# Patient Record
Sex: Male | Born: 1935 | Race: White | Hispanic: No | Smoking: Current every day smoker
Health system: Southern US, Community
[De-identification: ages and names within clinical notes are randomized; demographics above are authoritative.]

## PROBLEM LIST (undated history)

## (undated) DIAGNOSIS — I219 Acute myocardial infarction, unspecified: Secondary | ICD-10-CM

## (undated) DIAGNOSIS — E785 Hyperlipidemia, unspecified: Secondary | ICD-10-CM

## (undated) DIAGNOSIS — M1990 Unspecified osteoarthritis, unspecified site: Secondary | ICD-10-CM

## (undated) DIAGNOSIS — I739 Peripheral vascular disease, unspecified: Secondary | ICD-10-CM

## (undated) DIAGNOSIS — Z720 Tobacco use: Secondary | ICD-10-CM

## (undated) DIAGNOSIS — F1010 Alcohol abuse, uncomplicated: Secondary | ICD-10-CM

## (undated) DIAGNOSIS — I509 Heart failure, unspecified: Secondary | ICD-10-CM

## (undated) HISTORY — PX: CATARACT EXTRACTION W/ INTRAOCULAR LENS IMPLANT: SHX1309

## (undated) HISTORY — DX: Unspecified osteoarthritis, unspecified site: M19.90

## (undated) HISTORY — DX: Acute myocardial infarction, unspecified: I21.9

## (undated) HISTORY — DX: Heart failure, unspecified: I50.9

## (undated) HISTORY — DX: Hyperlipidemia, unspecified: E78.5

---

## 2007-01-24 DIAGNOSIS — I219 Acute myocardial infarction, unspecified: Secondary | ICD-10-CM

## 2007-01-24 HISTORY — DX: Acute myocardial infarction, unspecified: I21.9

## 2007-02-24 HISTORY — PX: EYE SURGERY: SHX253

## 2007-10-24 HISTORY — PX: CARDIAC CATHETERIZATION: SHX172

## 2010-08-09 ENCOUNTER — Encounter (INDEPENDENT_AMBULATORY_CARE_PROVIDER_SITE_OTHER): Payer: PRIVATE HEALTH INSURANCE | Admitting: Vascular Surgery

## 2010-08-09 DIAGNOSIS — I7092 Chronic total occlusion of artery of the extremities: Secondary | ICD-10-CM

## 2010-08-10 NOTE — Consult Note (Signed)
NEW PATIENT CONSULTATION  Nathaniel Curtis, Nathaniel Curtis DOB:  Apr 30, 1931                                       08/09/2010 ZOXWR#:[id number]  Patient presents today for evaluation of severe lower extremity arterial insufficiency.  He is a 77 year old gentleman with a long past cardiac and peripheral vascular history.  He had bilateral tissue loss on his toes and has completely healed the left foot and has nearly completely healed an ulceration over the dorsum of his right second toe.  He has had evaluation in [place], and I have this for review.  He did undergo noninvasive vascular laboratory studies there showing an ankle arm index of __________.  The waveforms suggest bilateral iliac or aortic stenosis.  He does have a history of prior catheterization attempt and reports that they were unable to access via his groin and has what sounds either a radial or a brachial approach for a cardiac cath.  He does have severe buttock claudication with walking.  He has had an extensive cardiac evaluation, and this does show severe cardiomyopathy with 20% ejection fraction.  PAST MEDICAL HISTORY:  Significant for elevated cholesterol, prior myocardial infarction.  SOCIAL HISTORY:  He is widowed with 8 children.  He smokes less than a pack of cigarettes per day.  He does have several alcohol drinks per night.  FAMILY HISTORY:  Negative for premature atherosclerotic disease.  REVIEW OF SYSTEMS:  No weight loss or gain.  He weighs 152 pounds.  He is 5 feet 7 inches tall. CARDIAC:  As above. MUSCULOSKELETAL:  For arthritis and joint pain, otherwise negative.  PHYSICAL EXAMINATION:  A well-developed and well-nourished white male appearing stated age in no acute stress.  Blood pressure is 146/72, pulse 100, respirations 20.  HEENT is normal.  His carotid arteries are without bruits bilaterally.  He has 2+ radial pulses bilaterally.  He has absent femoral and distal pulses bilaterally.   Heart:  Regular rate and rhythm without murmur. Chest:  Clear bilaterally.  Abdomen:  Soft, nontender.  No aneurysm or other masses noted.  Musculoskeletal shows no major deformity or cyanosis.  Neurologic:  No focal weakness or paresthesias.  Skin without ulcers or rashes except for the nearly healed superficial ulceration over his second toe on the right.  I discussed options with patient.  I would recommend a CT angiogram prior to attempting arteriography.  I suspect that there may be a high chance that he has complete occlusion of his aortoiliac segment with collateral flow.  If this is the case, he would require either aortobifemoral bypass or extra anatomic bypass.  I feel that his cardiac status is certainly marginal for attempt at aortofemoral bypass.  He is healing his lesions, and if he is not a percutaneous candidate, may be best served with observation only.  He understands and will undergo his CT angiogram at [facility], and then I will see him back for discussion pending this result.    Larina Earthly, M.D. Electronically Signed  TFE/MEDQ  D:  08/09/2010  T:  08/10/2010  Job:  [id number]  cc:   Dr. Terrilee Files, [place] Junious Dresser, MD

## 2010-08-31 ENCOUNTER — Encounter: Payer: Self-pay | Admitting: Vascular Surgery

## 2010-09-06 ENCOUNTER — Encounter: Payer: Self-pay | Admitting: Vascular Surgery

## 2010-09-06 ENCOUNTER — Ambulatory Visit (INDEPENDENT_AMBULATORY_CARE_PROVIDER_SITE_OTHER): Payer: PRIVATE HEALTH INSURANCE | Admitting: Vascular Surgery

## 2010-09-06 VITALS — BP 147/72 | HR 87 | Resp 16 | Ht 67.0 in | Wt 155.0 lb

## 2010-09-06 DIAGNOSIS — I7092 Chronic total occlusion of artery of the extremities: Secondary | ICD-10-CM

## 2010-09-06 NOTE — Progress Notes (Signed)
Subjective:     Patient ID: Nathaniel Curtis, male   DOB: January 06, 1932, 77 y.o.   MRN: [id number]  HPI The patient presents today for followup of his severe bilateral lower extremity arterial insufficiency. I had seen him in initial office visit on 08/09/2010. He had slowly healing ulcers on both feet unfortunately has gone on to complete healing on the left and near total healing on the right. He is here today discussed CT angiogram of his abdomen and pelvis. He does have severe claudication symptoms as well. He has known cardiomyopathy with diminished ejection fraction.  Review of Systems  Constitutional: Positive for activity change.  HENT: Positive for neck pain.   Eyes: Negative.   Respiratory: Negative.   Cardiovascular: Positive for leg swelling.  Gastrointestinal: Negative.   Genitourinary: Negative.   Musculoskeletal: Positive for back pain, arthralgias and gait problem.  Neurological: Negative.   Hematological: Bruises/bleeds easily.  Psychiatric/Behavioral: Negative.        Objective:   Physical Exam Well-developed well-nourished white male in no acute distress 2+ radial pulses bilaterally. Absent femoral and distal pulses bilaterally.   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 Extensive narrowing of his external iliac arteries bilaterally. Patent femoral and popliteal arteries bilaterally.    Assessment:     I discussed the significance of this with the patient. I explained that the indication for treatment would be progressive tissue loss. He has nearly healed his foot lesions bilaterally. He reports that he is unable to tolerate his level of pain in his lower extremities. I explained the option for aortofemoral bypass or extra-anatomic bypass with an Ax-fem and fem-fem bypass     Plan:     The patient is scheduled to see his cardiologist for preoperative clearance on September 11. I  will see him back for repeat office visit following this time to discuss treatment options.

## 2010-10-10 ENCOUNTER — Encounter: Payer: Self-pay | Admitting: Vascular Surgery

## 2010-10-11 ENCOUNTER — Ambulatory Visit: Payer: PRIVATE HEALTH INSURANCE | Admitting: Vascular Surgery

## 2011-02-07 ENCOUNTER — Ambulatory Visit: Payer: PRIVATE HEALTH INSURANCE | Admitting: Vascular Surgery

## 2011-09-24 ENCOUNTER — Inpatient Hospital Stay (HOSPITAL_COMMUNITY)
Admission: EM | Admit: 2011-09-24 | Discharge: 2011-09-29 | DRG: 300 | Disposition: A | Discharge status: Home / Self Care | Payer: Medicare Other | Attending: Internal Medicine | Admitting: Internal Medicine

## 2011-09-24 ENCOUNTER — Encounter (HOSPITAL_COMMUNITY): Payer: Self-pay | Admitting: Nurse Practitioner

## 2011-09-24 DIAGNOSIS — Z9849 Cataract extraction status, unspecified eye: Secondary | ICD-10-CM

## 2011-09-24 DIAGNOSIS — I745 Embolism and thrombosis of iliac artery: Principal | ICD-10-CM | POA: Diagnosis present

## 2011-09-24 DIAGNOSIS — L0390 Cellulitis, unspecified: Secondary | ICD-10-CM

## 2011-09-24 DIAGNOSIS — I359 Nonrheumatic aortic valve disorder, unspecified: Secondary | ICD-10-CM | POA: Diagnosis present

## 2011-09-24 DIAGNOSIS — M79609 Pain in unspecified limb: Secondary | ICD-10-CM

## 2011-09-24 DIAGNOSIS — Z720 Tobacco use: Secondary | ICD-10-CM

## 2011-09-24 DIAGNOSIS — I739 Peripheral vascular disease, unspecified: Secondary | ICD-10-CM

## 2011-09-24 DIAGNOSIS — E785 Hyperlipidemia, unspecified: Secondary | ICD-10-CM

## 2011-09-24 DIAGNOSIS — I10 Essential (primary) hypertension: Secondary | ICD-10-CM

## 2011-09-24 DIAGNOSIS — I252 Old myocardial infarction: Secondary | ICD-10-CM

## 2011-09-24 DIAGNOSIS — E871 Hypo-osmolality and hyponatremia: Secondary | ICD-10-CM | POA: Diagnosis present

## 2011-09-24 DIAGNOSIS — H409 Unspecified glaucoma: Secondary | ICD-10-CM | POA: Diagnosis present

## 2011-09-24 DIAGNOSIS — I509 Heart failure, unspecified: Secondary | ICD-10-CM

## 2011-09-24 DIAGNOSIS — L02619 Cutaneous abscess of unspecified foot: Secondary | ICD-10-CM | POA: Diagnosis present

## 2011-09-24 DIAGNOSIS — Z961 Presence of intraocular lens: Secondary | ICD-10-CM

## 2011-09-24 DIAGNOSIS — F1010 Alcohol abuse, uncomplicated: Secondary | ICD-10-CM

## 2011-09-24 DIAGNOSIS — B351 Tinea unguium: Secondary | ICD-10-CM | POA: Diagnosis present

## 2011-09-24 DIAGNOSIS — L03119 Cellulitis of unspecified part of limb: Secondary | ICD-10-CM | POA: Diagnosis present

## 2011-09-24 DIAGNOSIS — F17200 Nicotine dependence, unspecified, uncomplicated: Secondary | ICD-10-CM

## 2011-09-24 DIAGNOSIS — L03115 Cellulitis of right lower limb: Secondary | ICD-10-CM

## 2011-09-24 DIAGNOSIS — Z01810 Encounter for preprocedural cardiovascular examination: Secondary | ICD-10-CM

## 2011-09-24 DIAGNOSIS — R0989 Other specified symptoms and signs involving the circulatory and respiratory systems: Secondary | ICD-10-CM

## 2011-09-24 HISTORY — DX: Alcohol abuse, uncomplicated: F10.10

## 2011-09-24 HISTORY — DX: Peripheral vascular disease, unspecified: I73.9

## 2011-09-24 HISTORY — DX: Tobacco use: Z72.0

## 2011-09-24 LAB — POCT I-STAT, CHEM 8
Chloride: 100 mEq/L (ref 96–112)
HCT: 35 % — ABNORMAL LOW (ref 39.0–52.0)
Hemoglobin: 11.9 g/dL — ABNORMAL LOW (ref 13.0–17.0)
Potassium: 4.2 mEq/L (ref 3.5–5.1)
Sodium: 136 mEq/L (ref 135–145)

## 2011-09-24 LAB — CREATININE, SERUM
GFR calc Af Amer: >90 mL/min (ref >90)
GFR calc non Af Amer: 84 mL/min — ABNORMAL LOW (ref >90)

## 2011-09-24 LAB — CBC
Hemoglobin: 12 g/dL — ABNORMAL LOW (ref 13.0–17.0)
MCHC: 34.9 g/dL (ref 30.0–36.0)
MCHC: 34 g/dL (ref 30.0–36.0)
Platelets: 328 10*3/uL (ref 150–400)
RDW: 15.1 % (ref 11.5–15.5)
RDW: 15.1 % (ref 11.5–15.5)
WBC: 9.5 10*3/uL (ref 4.0–10.5)
WBC: 9.1 10*3/uL (ref 4.0–10.5)

## 2011-09-24 MED ORDER — LISINOPRIL 2.5 MG PO TABS
2.5000 mg | ORAL_TABLET | Freq: Every day | ORAL | Status: DC
  Administered 2011-09-25 – 2011-09-29 (×5): 2.5 mg via ORAL
  Filled 2011-09-24 (×5): qty 1

## 2011-09-24 MED ORDER — ENOXAPARIN SODIUM 40 MG/0.4ML ~~LOC~~ SOLN
40.0000 mg | SUBCUTANEOUS | Status: DC
  Administered 2011-09-24 – 2011-09-28 (×5): 40 mg via SUBCUTANEOUS
  Filled 2011-09-24 (×6): qty 0.4

## 2011-09-24 MED ORDER — SODIUM CHLORIDE 0.9 % IV SOLN
250.0000 mL | INTRAVENOUS | Status: DC | PRN
  Administered 2011-09-29: 250 mL via INTRAVENOUS

## 2011-09-24 MED ORDER — ONDANSETRON HCL 4 MG PO TABS: 4.0000 mg | ORAL_TABLET | Freq: Four times a day (QID) | ORAL | Status: DC | PRN

## 2011-09-24 MED ORDER — FOLIC ACID 1 MG PO TABS
1.0000 mg | ORAL_TABLET | Freq: Every day | ORAL | Status: DC
  Administered 2011-09-24 – 2011-09-29 (×6): 1 mg via ORAL
  Filled 2011-09-24 (×6): qty 1

## 2011-09-24 MED ORDER — NITROGLYCERIN 0.4 MG SL SUBL: 0.4000 mg | SUBLINGUAL_TABLET | SUBLINGUAL | Status: DC | PRN

## 2011-09-24 MED ORDER — ONDANSETRON HCL 4 MG/2ML IJ SOLN
4.0000 mg | Freq: Four times a day (QID) | INTRAMUSCULAR | Status: DC | PRN
Start: 2011-09-24 — End: 2011-09-29

## 2011-09-24 MED ORDER — ACETAMINOPHEN 650 MG RE SUPP: 650.0000 mg | Freq: Four times a day (QID) | RECTAL | Status: DC | PRN

## 2011-09-24 MED ORDER — OXYCODONE HCL 5 MG PO TABS
5.0000 mg | ORAL_TABLET | ORAL | Status: DC | PRN
  Administered 2011-09-24 – 2011-09-29 (×14): 5 mg via ORAL
  Filled 2011-09-24 (×15): qty 1

## 2011-09-24 MED ORDER — ACETAMINOPHEN 325 MG PO TABS: 650.0000 mg | ORAL_TABLET | Freq: Four times a day (QID) | ORAL | Status: DC | PRN

## 2011-09-24 MED ORDER — BIMATOPROST 0.03 % OP SOLN
1.0000 [drp] | Freq: Every day | OPHTHALMIC | Status: DC
  Filled 2011-09-24: qty 2.5

## 2011-09-24 MED ORDER — PIPERACILLIN-TAZOBACTAM 3.375 G IVPB
3.3750 g | Freq: Three times a day (TID) | INTRAVENOUS | Status: DC
  Administered 2011-09-24 – 2011-09-29 (×14): 3.375 g via INTRAVENOUS
  Filled 2011-09-24 (×18): qty 50

## 2011-09-24 MED ORDER — SODIUM CHLORIDE 0.9 % IJ SOLN: 3.0000 mL | INTRAMUSCULAR | Status: DC | PRN

## 2011-09-24 MED ORDER — SPIRONOLACTONE 25 MG PO TABS
25.0000 mg | ORAL_TABLET | Freq: Every day | ORAL | Status: DC
  Administered 2011-09-24 – 2011-09-28 (×5): 25 mg via ORAL
  Filled 2011-09-24 (×6): qty 1

## 2011-09-24 MED ORDER — SIMVASTATIN 20 MG PO TABS
20.0000 mg | ORAL_TABLET | Freq: Every day | ORAL | Status: DC
  Administered 2011-09-24 – 2011-09-28 (×5): 20 mg via ORAL
  Filled 2011-09-24 (×6): qty 1

## 2011-09-24 MED ORDER — METOPROLOL SUCCINATE ER 25 MG PO TB24
25.0000 mg | ORAL_TABLET | Freq: Every day | ORAL | Status: DC
  Administered 2011-09-25 – 2011-09-27 (×3): 25 mg via ORAL
  Filled 2011-09-24 (×3): qty 1

## 2011-09-24 MED ORDER — BIMATOPROST 0.01 % OP SOLN
1.0000 [drp] | Freq: Every day | OPHTHALMIC | Status: DC
  Administered 2011-09-24 – 2011-09-28 (×5): 1 [drp] via OPHTHALMIC
  Filled 2011-09-24: qty 2.5

## 2011-09-24 MED ORDER — FUROSEMIDE 20 MG PO TABS
20.0000 mg | ORAL_TABLET | Freq: Every day | ORAL | Status: DC
  Administered 2011-09-25 – 2011-09-26 (×2): 20 mg via ORAL
  Filled 2011-09-24 (×4): qty 1

## 2011-09-24 MED ORDER — ASPIRIN 81 MG PO TBEC: 81.0000 mg | DELAYED_RELEASE_TABLET | Freq: Every day | ORAL | Status: DC

## 2011-09-24 MED ORDER — SENNOSIDES-DOCUSATE SODIUM 8.6-50 MG PO TABS: 1.0000 | ORAL_TABLET | Freq: Every evening | ORAL | Status: DC | PRN

## 2011-09-24 MED ORDER — VANCOMYCIN HCL IN DEXTROSE 1-5 GM/200ML-% IV SOLN
1000.0000 mg | Freq: Once | INTRAVENOUS | Status: AC
  Administered 2011-09-24: 1000 mg via INTRAVENOUS
  Filled 2011-09-24: qty 200

## 2011-09-24 MED ORDER — PIPERACILLIN-TAZOBACTAM 3.375 G IVPB: 3.3750 g | Freq: Once | INTRAVENOUS | Status: DC

## 2011-09-24 MED ORDER — ASPIRIN EC 81 MG PO TBEC
81.0000 mg | DELAYED_RELEASE_TABLET | Freq: Every day | ORAL | Status: DC
  Administered 2011-09-24 – 2011-09-29 (×6): 81 mg via ORAL
  Filled 2011-09-24 (×6): qty 1

## 2011-09-24 MED ORDER — VANCOMYCIN HCL 1000 MG IV SOLR
1250.0000 mg | INTRAVENOUS | Status: DC
  Administered 2011-09-25 – 2011-09-28 (×4): 1250 mg via INTRAVENOUS
  Filled 2011-09-24 (×5): qty 1250

## 2011-09-24 MED ORDER — CLOPIDOGREL BISULFATE 75 MG PO TABS
75.0000 mg | ORAL_TABLET | Freq: Every day | ORAL | Status: DC
  Administered 2011-09-25 – 2011-09-29 (×5): 75 mg via ORAL
  Filled 2011-09-24 (×6): qty 1

## 2011-09-24 MED ORDER — GABAPENTIN 600 MG PO TABS
600.0000 mg | ORAL_TABLET | Freq: Three times a day (TID) | ORAL | Status: DC
  Administered 2011-09-24 – 2011-09-29 (×14): 600 mg via ORAL
  Filled 2011-09-24 (×16): qty 1

## 2011-09-24 MED ORDER — NICOTINE 21 MG/24HR TD PT24
21.0000 mg | MEDICATED_PATCH | Freq: Every day | TRANSDERMAL | Status: DC
  Filled 2011-09-24 (×6): qty 1

## 2011-09-24 MED ORDER — SODIUM CHLORIDE 0.9 % IJ SOLN
3.0000 mL | Freq: Two times a day (BID) | INTRAMUSCULAR | Status: DC
  Administered 2011-09-25 – 2011-09-29 (×2): 3 mL via INTRAVENOUS

## 2011-09-24 MED ORDER — MORPHINE SULFATE 2 MG/ML IJ SOLN
1.0000 mg | INTRAMUSCULAR | Status: DC | PRN
  Administered 2011-09-24 – 2011-09-29 (×15): 1 mg via INTRAVENOUS
  Filled 2011-09-24 (×16): qty 1

## 2011-09-24 MED ORDER — PNEUMOCOCCAL VAC POLYVALENT 25 MCG/0.5ML IJ INJ
0.5000 mL | INJECTION | INTRAMUSCULAR | Status: AC
  Administered 2011-09-25: 0.5 mL via INTRAMUSCULAR
  Filled 2011-09-24: qty 0.5

## 2011-09-24 MED ORDER — NIACIN ER 500 MG PO CPCR
1000.0000 mg | ORAL_CAPSULE | Freq: Every day | ORAL | Status: DC
  Administered 2011-09-24 – 2011-09-28 (×5): 1000 mg via ORAL
  Filled 2011-09-24 (×6): qty 2

## 2011-09-24 MED ORDER — VITAMIN B-1 100 MG PO TABS
100.0000 mg | ORAL_TABLET | Freq: Every day | ORAL | Status: DC
  Administered 2011-09-24 – 2011-09-29 (×6): 100 mg via ORAL
  Filled 2011-09-24 (×6): qty 1

## 2011-09-24 NOTE — ED Notes (Signed)
Pt reports history of poor circulation in R leg, Friday began to have severe R leg pain. Pt states he was told by dr early at one point he needed surgery on the leg but pt states " i was too stubborn and i dont want to come to the hospital." states granddaughter forced him to go to [facility] in [place] today and the doctor there sent him to ER. Pt RLE with redness, edema, open wound to foot.

## 2011-09-24 NOTE — Progress Notes (Signed)
VASCULAR LAB PRELIMINARY  ARTERIAL  ABI completed: Bilateral ABIs indicate severe reduction in arterial blood flow with abnormal waveforms.    RIGHT    LEFT    PRESSURE WAVEFORM  PRESSURE WAVEFORM  BRACHIAL 145 T BRACHIAL 140 T  DP   DP    AT 58 DM AT 71 DM  PT 71 DM PT 72 DM  PER   PER    GREAT TOE 88 NA GREAT TOE 54 NA    RIGHT LEFT  ABI 0.61 0.37     Nylee Barbuto, 09/24/2011, 5:31 PM

## 2011-09-24 NOTE — Consult Note (Signed)
[facility]  Referred by:  Dr. Lorenso Courier [facility] ED)  Reason for referral: BLE chronic ischemia  History of Present Illness  Nathaniel Curtis is a 78 y.o. (30-Nov-1931) male who presents with chief complaint: bilateral leg pain.  Onset of symptom occurred years ago, patient is unclear exact chronology.  Patient was evaluated by Dr. Arbie Cookey last year and was found to have significant iliac disease.  An Aortobifemoral vs axillobifemoral BPG was recommended to the patient.  He was to obtain Cardiology preoperative evaluation and then return for determination of his intervention, but patient was lost to follow-up.  Patient returns to ER today with severe pain at the urging of his grand daughtered.  Pain is described as "hurts", severity 10/10, and associated with rest and movement.  Patient has attempted to treat this pain with pain medications.  The patient has rest pain symptoms also and denies leg wounds/ulcers.  Atherosclerotic risk factors include: hyperlipidemia and smoking.  Past Medical History  Diagnosis Date  . Hyperlipidemia   . CHF (congestive heart failure)   . Heart attack 2009  . Arthritis   . Aortic stenosis   . Glaucoma     Past Surgical History  Procedure Date  . Cataract extraction w/ intraocular lens implant RIGHT EYE 2009    History   Social History  . Marital Status: Widowed    Spouse Name: N/A    Number of Children: N/A  . Years of Education: N/A   Occupational History  . Not on file.   Social History Main Topics  . Smoking status: Current Everyday Smoker -- 1.0 packs/day    Types: Cigarettes  . Smokeless tobacco: Not on file  . Alcohol Use: Yes     3 a night  . Drug Use: No  . Sexually Active:    Other Topics Concern  . Not on file   Social History Narrative  . No narrative on file    History reviewed. No pertinent family history.  No current facility-administered medications on file prior to encounter.   Current Outpatient  Prescriptions on File Prior to Encounter  Medication Sig Dispense Refill  . aspirin 81 MG EC tablet Take 81 mg by mouth daily.        . bimatoprost (LUMIGAN) 0.03 % ophthalmic solution Place 1 drop into the right eye at bedtime.        . furosemide (LASIX) 20 MG tablet Take 20 mg by mouth daily.        Marland Kitchen lisinopril (PRINIVIL,ZESTRIL) 2.5 MG tablet Take 2.5 mg by mouth daily.        . metoprolol succinate (TOPROL-XL) 25 MG 24 hr tablet Take 25 mg by mouth daily.      . niacin (NIASPAN) 500 MG CR tablet Take 1,000 mg by mouth at bedtime.      . simvastatin (ZOCOR) 20 MG tablet Take 20 mg by mouth at bedtime.        Marland Kitchen spironolactone (ALDACTONE) 25 MG tablet Take 25 mg by mouth at bedtime.       . nitroGLYCERIN (NITROSTAT) 0.4 MG SL tablet Place 0.4 mg under the tongue every 5 (five) minutes as needed. As needed for chest pain.        Allergies  Allergen Reactions  . Lyrica (Pregabalin) Swelling    Swelling in feet and ankles.    REVIEW OF SYSTEMS:  (Positives checked otherwise negative)  CARDIOVASCULAR:  [ ]  chest pain, [ ]  chest pressure, [ ]   palpitations, [ ]  shortness of breath when laying flat, [ ]  shortness of breath with exertion,   [x]  pain in feet when walking, [x]  pain in feet when laying flat, [ ]  history of blood clot in veins (DVT), [ ]  history of phlebitis, [ ]  swelling in legs, [ ]  varicose veins  PULMONARY:  [ ]  productive cough, [ ]  asthma, [ ]  wheezing  NEUROLOGIC:  [ ]  weakness in arms or legs, [ ]  numbness in arms or legs, [ ]  difficulty speaking or slurred speech, [ ]  temporary loss of vision in one eye, [ ]  dizziness  HEMATOLOGIC:  [ ]  bleeding problems, [ ]  problems with blood clotting too easily  MUSCULOSKEL:  [ ]  joint pain, [ ]  joint swelling  GASTROINTEST:  [ ]   Vomiting blood, [ ]   Blood in stool     GENITOURINARY:  [ ]   Burning with urination, [ ]   Blood in urine  PSYCHIATRIC:  [ ]  history of major depression  INTEGUMENTARY:  [x]  rashes, [ ]   ulcers  CONSTITUTIONAL:  [ ]  fever, [ ]  chills   Physical Examination  Filed Vitals:   09/24/11 1437 09/24/11 1732  BP: 116/74 133/66  Pulse: 89   Temp: 98.7 F (37.1 C) 98.2 F (36.8 C)  TempSrc:  Oral  Resp: 16 18  SpO2: 95% 95%   There is no height or weight on file to calculate BMI.  General: A&O x 3, WN, elderly  Head: [place]/AT  Ear/Nose/Throat: Hearing grossly intact, nares w/o erythema or drainage, oropharynx w/o Erythema/Exudate  Eyes: PERRLA, EOMI  Neck: Supple, no nuchal rigidity, no palpable LAD  Pulmonary: Sym exp, good air movt, CTAB, no rales, rhonchi, & wheezing  Cardiac: RRR, Nl S1, S2, no Murmurs, rubs or gallops  Vascular: Vessel Right Left  Radial Palpable Palpable  Ulnar Palpable Palpable  Brachial Palpable Palpable  Carotid Palpable, without bruit Palpable, without bruit  Aorta Not palpable N/A  Femoral Faintly Palpable Faintly Palpable  Popliteal Not palpable Not palpable  PT Not Palpable Not Palpable  DP Not Palpable Not Palpable   Gastrointestinal: soft, NTND, -G/R, - HSM, - masses, - CVAT B  Musculoskeletal: M/S 5/5 throughout including B DF/PF, B calves warm, B feet room temperature, petechial rashes on both feet and shins, some skin breakdown on toes on right side, arthritic changes in hands and right elbow  Neurologic: CN 2-12 intact , Pain and light touch intact in extremities including feet, Motor exam as listed above  Psychiatric: Judgment intact, Mood & affect appropriatefor pt's clinical situation  Dermatologic: See M/S exam for extremity exam, no rashes otherwise noted  Lymph : No Cervical, Axillary, or Inguinal lymphadenopathy   Non-Invasive Vascular Imaging  ABI (Date: 09/24/11)  RLE: 0.61, AT and PT: dampened monophasic  LLE: 0.37, AT and PT: dampened monophasic  Laboratory: CBC:    Component Value Date/Time   WBC 9.5 09/24/2011 1440   RBC 3.71* 09/24/2011 1440   HGB 11.9* 09/24/2011 1500   HCT 35.0* 09/24/2011 1500    PLT 366 09/24/2011 1440   MCV 92.7 09/24/2011 1440   MCH 32.3 09/24/2011 1440   MCHC 34.9 09/24/2011 1440   RDW 15.1 09/24/2011 1440    BMP:    Component Value Date/Time   NA 136 09/24/2011 1500   K 4.2 09/24/2011 1500   CL 100 09/24/2011 1500   GLUCOSE 103* 09/24/2011 1500   BUN 6 09/24/2011 1500   CREATININE 1.00 09/24/2011 1500    Coagulation: No  results found for this basename: INR, PROTIME   No results found for this basename: PTT   Medical Decision Making  Jamar Weatherall is a 78 y.o. male who presents with: known BLE chronic critical limb ischemia.   This patient has no evidence of a threatened limb with bilateral intact neurologic function and dopplerable signals.  Pt is being admitted to medical service for preoperative optimization.  Given the 5-10% mortality rate in national series for aortobifemoral bypass, preoperative cardiology consultation is necessary additionally.  A new echocardiogram will be needed to evaluate the severity of his known aortic stenosis.  Some of this work up may be available from his outside cardiologist.  Will plan on Aortogram, bilateral leg runoff, possible intervention via a left femoral or brachial approach on Tuesday with Dr. Myra Gianotti.  I discussed with the patient the natural history of critical limb ischemia: 25% require amputation in one year, 50% are able to maintain their limbs in one year, and 25-30% die in one year due to comorbidities. I discussed with the patient the nature of angiographic procedures, especially the limited patencies of any endovascular intervention. The patient is aware of that the risks of an angiographic procedure include but are not limited to: bleeding, infection, access site complications, embolization, rupture of treated vessel, dissection, possible need for emergent surgical intervention, and possible need for surgical procedures to treat the patient's pathology.  I discussed in depth with the patient the nature of  atherosclerosis, and emphasized the importance of maximal medical management including strict control of blood pressure, blood glucose, and lipid levels, antiplatelet agents, obtaining regular exercise, and cessation of smoking.  The patient is aware that without maximal medical management the underlying atherosclerotic disease process will progress, limiting the benefit of any interventions.  Thank you for allowing [place] to participate in this patient's care.  Leonides Sake, MD [facility] Office: [phone] Pager: [phone]  09/24/2011, 5:49 PM

## 2011-09-24 NOTE — ED Provider Notes (Addendum)
History     CSN: [id number]  Arrival date & time 09/24/11  1421   First MD Initiated Contact with Patient 09/24/11 1549      Chief Complaint  Patient presents with  . Leg Pain    (Consider location/radiation/quality/duration/timing/severity/associated sxs/prior treatment) HPI Comments: Mr. Kornegay presents for evaluation of discoloration of his right foot with weeping drainage.  He has a known history of peripheral vascular disease and was recommended for a revascularization procedure some time ago. He was not amenable to having the surgery performed at that time. Earlier today he was seen at an urgent care for evaluation of his foot. A visiting relative was concerned because it had not improved with the treatment he is already received. He states that over the last week he has had pain in his right foot, followed by redness, breakdown of the skin, and now clear weeping fluid. He reports that he is also had some similar drainage from between the toes on his left foot, but this is greatly improved with wound care. He has a neighbor that has been assisting him with the care of this. He denies any fevers, chills, nausea, vomiting and states that he otherwise feels well. He reports that he currently smokes.  Patient is a 78 y.o. male presenting with lower extremity pain. The history is provided by the patient.  Foot Pain This is a new problem. The current episode started more than 2 days ago (almost a week). The problem has been gradually worsening. Pertinent negatives include no chest pain, no abdominal pain, no headaches and no shortness of breath. Nothing aggravates the symptoms. Nothing relieves the symptoms.    Past Medical History  Diagnosis Date  . Hyperlipidemia   . CHF (congestive heart failure)   . Heart attack 2009  . Arthritis   . Aortic stenosis   . Glaucoma     Past Surgical History  Procedure Date  . Cataract extraction w/ intraocular lens implant RIGHT EYE 2009     History reviewed. No pertinent family history.  History  Substance Use Topics  . Smoking status: Current Everyday Smoker -- 1.0 packs/day    Types: Cigarettes  . Smokeless tobacco: Not on file  . Alcohol Use: Yes     3 a night      Review of Systems  Constitutional: Negative for fever, chills, activity change, appetite change and fatigue.  HENT: Negative for congestion, sore throat, facial swelling, rhinorrhea and trouble swallowing.   Eyes: Negative.   Respiratory: Positive for cough. Negative for choking, chest tightness, shortness of breath and wheezing.        Chronic  Cardiovascular: Negative for chest pain.  Gastrointestinal: Negative for nausea, abdominal pain, diarrhea, constipation and blood in stool.  Genitourinary: Negative.   Musculoskeletal: Positive for arthralgias. Negative for back pain, joint swelling and gait problem.  Skin: Positive for color change, pallor and wound. Negative for rash.  Neurological: Negative for tremors, syncope, weakness and headaches.  Psychiatric/Behavioral: Negative for dysphoric mood, decreased concentration and agitation.    Allergies  Lyrica  Home Medications   Current Outpatient Rx  Name Route Sig Dispense Refill  . ASPIRIN 81 MG PO TBEC Oral Take 81 mg by mouth daily.      Marland Kitchen BIMATOPROST 0.03 % OP SOLN Right Eye Place 1 drop into the right eye at bedtime.      . CLOPIDOGREL BISULFATE 75 MG PO TABS Oral Take 75 mg by mouth daily.    . FUROSEMIDE 20  MG PO TABS Oral Take 20 mg by mouth daily.      Marland Kitchen GABAPENTIN 600 MG PO TABS Oral Take 600 mg by mouth 3 (three) times daily.    Marland Kitchen LISINOPRIL 2.5 MG PO TABS Oral Take 2.5 mg by mouth daily.      Marland Kitchen METOPROLOL SUCCINATE ER 25 MG PO TB24 Oral Take 25 mg by mouth daily.    Marland Kitchen NIACIN ER (ANTIHYPERLIPIDEMIC) 500 MG PO TBCR Oral Take 1,000 mg by mouth at bedtime.    Marland Kitchen SIMVASTATIN 20 MG PO TABS Oral Take 20 mg by mouth at bedtime.      . SPIRONOLACTONE 25 MG PO TABS Oral Take 25 mg by  mouth at bedtime.     Marland Kitchen NITROGLYCERIN 0.4 MG SL SUBL Sublingual Place 0.4 mg under the tongue every 5 (five) minutes as needed. As needed for chest pain.      BP 116/74  Pulse 89  Temp 98.7 F (37.1 C)  Resp 16  SpO2 95%  Physical Exam  Nursing note and vitals reviewed. Constitutional: He is oriented to person, place, and time. No distress.       Pt appears thin but not cachectic  HENT:  Head: Normocephalic and atraumatic.  Right Ear: External ear normal.  Left Ear: External ear normal.  Nose: Nose normal.  Mouth/Throat: Oropharynx is clear and moist. No oropharyngeal exudate.  Eyes: Conjunctivae and EOM are normal. Pupils are equal, round, and reactive to light. Right eye exhibits no discharge. Left eye exhibits no discharge. No scleral icterus.  Neck: Normal range of motion. Neck supple. No JVD present. No tracheal deviation present. No thyromegaly present.  Cardiovascular: Normal rate, regular rhythm, S1 normal, S2 normal, normal heart sounds, intact distal pulses and normal pulses.  PMI is not displaced.  Exam reveals no gallop and no decreased pulses.   No murmur heard. Pulmonary/Chest: Effort normal. No accessory muscle usage or stridor. No apnea, not tachypneic and not bradypneic. No respiratory distress. He has decreased breath sounds. He has no wheezes. He has no rhonchi. He has no rales. He exhibits no tenderness.  Abdominal: Soft. Normal appearance and bowel sounds are normal. He exhibits no shifting dullness, no distension, no ascites, no pulsatile midline mass and no mass. There is no hepatosplenomegaly. There is no tenderness. There is no rebound, no guarding and no CVA tenderness. No hernia.  Musculoskeletal: Normal range of motion. He exhibits tenderness. He exhibits no edema.       Unable to palpate pulses in feet.  Lymphadenopathy:    He has no cervical adenopathy.  Neurological: He is alert and oriented to person, place, and time. No cranial nerve deficit.  Coordination normal.  Skin: Skin is warm and dry. He is not diaphoretic. There is erythema. There is pallor.       Pallor bilat feet.  Not breakdown of skin on right forefoot.  Erythma + weeping drainage.  Darkening of distal toes and distal plantar surface of foot.  Psychiatric: He has a normal mood and affect. His behavior is normal.    ED Course  Procedures (including critical care time)  Labs Reviewed  CBC - Abnormal; Notable for the following:    RBC 3.71 (*)     Hemoglobin 12.0 (*)     HCT 34.4 (*)     All other components within normal limits  POCT I-STAT, CHEM 8 - Abnormal; Notable for the following:    Glucose, Bld 103 (*)     Hemoglobin  11.9 (*)     HCT 35.0 (*)     All other components within normal limits   No results found.   No diagnosis found.   Date: 09/24/2011  Rate: 85 bpm  Rhythm: sinus  QRS Axis: normal  Intervals: borderline prolonged PR int (212 ms)  ST/T Wave abnormalities: normal  Conduction Disutrbances: PR int 212 ms  Narrative Interpretation:   Old EKG Reviewed: none available      MDM  Pt presents for evaluation of discoloration and drainage of wound on right foot.  He was having pain 2-3 days ago which has since improved.  Pt appears nontoxic, NAD.  Plan vascular study of leg (pt has known hx of PVD).  Will also start abx as pt has evidence of a cellulitis.  1627.  U/S pending.  Abx have been ordered.  Discussed with hospitalist and pt to be posted for admission.  Will also discuss with the on-call vascular surgeon.  1630.  Discussed with Dr. Imogene Burn (vascular surg).  He will follow his progress and provide recommendations.    Tobin Chad, MD 09/24/11 4098  Tobin Chad, MD 09/24/11 1191  Tobin Chad, MD 09/24/11 4782

## 2011-09-24 NOTE — H&P (Signed)
[facility]          History and Physical    PCP:   CAMPBELL,STEPHEN, MD   Vascular Surgeon: Gretta Began, MD  Chief Complaint:  "My right leg is flaring up"  HPI: Patient is a 78 year old white gentleman with a past medical history for tobacco abuse, CHF type unknown, hyperlipidemia and most importantly  peripheral vascular disease with complete occlusion of the right common iliac artery and almost complete occlusion of the left common iliac artery. He was seen by Dr. Gretta Began in July 2012 and at that point revascularization surgery was recommended however he refused at that time. Patient comes today at the urging of his granddaughter who is currently visiting him when she noticed that his right leg was red and weepy. Have been asked to admit him for further evaluation and management.  Allergies:   Allergies  Allergen Reactions  . Lyrica (Pregabalin) Swelling    Swelling in feet and ankles.      Past Medical History  Diagnosis Date  . Hyperlipidemia   . CHF (congestive heart failure)   . Heart attack 2009  . Arthritis   . Aortic stenosis   . Glaucoma     Past Surgical History  Procedure Date  . Cataract extraction w/ intraocular lens implant RIGHT EYE 2009    Prior to Admission medications   Medication Sig Start Date End Date Taking? Authorizing Provider  aspirin 81 MG EC tablet Take 81 mg by mouth daily.     Yes Historical Provider, MD  bimatoprost (LUMIGAN) 0.03 % ophthalmic solution Place 1 drop into the right eye at bedtime.     Yes Historical Provider, MD  clopidogrel (PLAVIX) 75 MG tablet Take 75 mg by mouth daily.   Yes Historical Provider, MD  furosemide (LASIX) 20 MG tablet Take 20 mg by mouth daily.     Yes Historical Provider, MD  gabapentin (NEURONTIN) 600 MG tablet Take 600 mg by mouth 3 (three) times daily.   Yes Historical Provider, MD  lisinopril (PRINIVIL,ZESTRIL) 2.5 MG tablet Take 2.5 mg by mouth daily.     Yes Historical Provider,  MD  metoprolol succinate (TOPROL-XL) 25 MG 24 hr tablet Take 25 mg by mouth daily.   Yes Historical Provider, MD  niacin (NIASPAN) 500 MG CR tablet Take 1,000 mg by mouth at bedtime.   Yes Historical Provider, MD  simvastatin (ZOCOR) 20 MG tablet Take 20 mg by mouth at bedtime.     Yes Historical Provider, MD  spironolactone (ALDACTONE) 25 MG tablet Take 25 mg by mouth at bedtime.    Yes Historical Provider, MD  nitroGLYCERIN (NITROSTAT) 0.4 MG SL tablet Place 0.4 mg under the tongue every 5 (five) minutes as needed. As needed for chest pain.    Historical Provider, MD    Social History:  reports that he has been smoking Cigarettes.  He has been smoking about 1 pack per day. He does not have any smokeless tobacco history on file. He reports that he drinks alcohol. He reports that he does not use illicit drugs.  History reviewed. No pertinent family history.  Review of Systems:  Constitutional: Denies fever, chills, diaphoresis, appetite change and fatigue.  HEENT: Denies photophobia, eye pain, redness, hearing loss, ear pain, congestion, sore throat, rhinorrhea, sneezing, mouth sores, trouble swallowing, neck pain, neck stiffness and tinnitus.   Respiratory: Denies SOB, DOE, cough, chest tightness,  and wheezing.   Cardiovascular: Denies chest pain, palpitations and leg swelling.  Gastrointestinal: Denies  nausea, vomiting, abdominal pain, diarrhea, constipation, blood in stool and abdominal distention.  Genitourinary: Denies dysuria, urgency, frequency, hematuria, flank pain and difficulty urinating.  Musculoskeletal: Denies myalgias, back pain, joint swelling, arthralgias and gait problem.  Skin: Denies pallor, rash and wound.  Neurological: Denies dizziness, seizures, syncope, weakness, light-headedness, numbness and headaches.  Hematological: Denies adenopathy. Easy bruising, personal or family bleeding history  Psychiatric/Behavioral: Denies suicidal ideation, mood changes, confusion,  nervousness, sleep disturbance and agitation   Physical Exam: Blood pressure 116/74, pulse 89, temperature 98.7 F (37.1 C), resp. rate 16, SpO2 95.00%. General: Alert, awake, oriented x3, in no acute distress, appears unkempt. HEENT: Normocephalic, atraumatic, pupils equal round and reactive to light, intact extraocular movements, moist mucous membranes, poor dentition. Neck: Supple, no JVD, no lymphadenopathy, no bruits, no goiter. Cardiovascular: Regular rate and rhythm, no murmurs, rubs or gallops. Lungs: Clear to auscultation bilaterally. Abdomen: Soft, nontender, nondistended, positive bowel sounds, no masses or organomegaly noted. Extremities: He has bilateral onychomycosis. On the left he has no edema, on the right he has redness to the dorsum of his foot and to the first 4 toes, he has maceration and weeping in between the toes as well. I am unable to palpate any popliteal or foot pulses. Neurologic: Grossly intact and nonfocal. Skin: Have not observed any rashes.   Labs on Admission:  Results for orders placed during the hospital encounter of 09/24/11 (from the past 48 hour(s))  CBC     Status: Abnormal   Collection Time   09/24/11  2:40 PM      Component Value Range Comment   WBC 9.5  4.0 - 10.5 K/uL    RBC 3.71 (*) 4.22 - 5.81 MIL/uL    Hemoglobin 12.0 (*) 13.0 - 17.0 g/dL    HCT 78.2 (*) 95.6 - 52.0 %    MCV 92.7  78.0 - 100.0 fL    MCH 32.3  26.0 - 34.0 pg    MCHC 34.9  30.0 - 36.0 g/dL    RDW 21.3  08.6 - 57.8 %    Platelets 366  150 - 400 K/uL   POCT I-STAT, CHEM 8     Status: Abnormal   Collection Time   09/24/11  3:00 PM      Component Value Range Comment   Sodium 136  135 - 145 mEq/L    Potassium 4.2  3.5 - 5.1 mEq/L    Chloride 100  96 - 112 mEq/L    BUN 6  6 - 23 mg/dL    Creatinine, Ser 4.69  0.50 - 1.35 mg/dL    Glucose, Bld 629 (*) 70 - 99 mg/dL    Calcium, Ion 5.28  4.13 - 1.30 mmol/L    TCO2 22  0 - 100 mmol/L    Hemoglobin 11.9 (*) 13.0 - 17.0 g/dL      HCT 24.4 (*) 01.0 - 52.0 %     Radiological Exams on Admission: No results found.  Assessment/Plan Principal Problem:  *PVD (peripheral vascular disease) with claudication Active Problems:  Cellulitis of right foot  HTN (hypertension)  Hyperlipidemia  Tobacco abuse  CHF, chronic   #1 peripheral vascular disease: He is noted to have complete occlusion of the right common iliac artery and almost complete occlusion of the left. I cannot palpate any pulses to that foot. Vascular surgery, Dr. Imogene Burn, has been consulted. ABIs are currently being performed.  #2 cellulitis of the right foot: We'll start him on IV vancomycin and Zosyn. Blood  cultures have been ordered. Patient is afebrile and nontoxic. I doubt that he will be able to heal well without a revascularization procedure.  #3 tobacco abuse: We'll place a nicotine patch while in the hospital, he has been counseled however is not willing to quit.  #4 history of alcohol abuse: We'll place on thiamine and folate while in the hospital.  #5 CHF, chronic, type unknown: From his medications would appear that he has systolic dysfunction however I am unable to locate an echo. Patient is from [place] and this is where he receives all of his medical care. We'll order a 2-D echo as he will most likely need this for preop clearance if we decide to proceed with revascularization procedure this hospitalization.   #6 DVT prophylaxis: Lovenox.  #7 CODE STATUS: This has been discussed with patient and he desires to BE a full code at this time.  Time Spent on Admission: 55 minutes.  Chaya Jan [facility] Pager: [phone] 09/24/2011, 5:17 PM

## 2011-09-24 NOTE — Progress Notes (Signed)
ANTIBIOTIC CONSULT NOTE - INITIAL  Pharmacy Consult for Vancomycin and Zosyn Indication: Cellulitis of R foot  Allergies  Allergen Reactions  . Lyrica (Pregabalin) Swelling    Swelling in feet and ankles.    Patient Measurements: Height: 5\' 7"  (170.2 cm) Weight: 154 lb 5.2 oz (70 kg) IBW/kg (Calculated) : 66.1   Vital Signs: Temp: 98.2 F (36.8 C) (09/01 1732) Temp src: Oral (09/01 1732) BP: 133/66 mmHg (09/01 1732) Pulse Rate: 89  (09/01 1437) Intake/Output from previous day:   Intake/Output from this shift:    Labs:  Basename 09/24/11 1500 09/24/11 1440  WBC -- 9.5  HGB 11.9* 12.0*  PLT -- 366  LABCREA -- --  CREATININE 1.00 --   Estimated Creatinine Clearance: 57.8 ml/min (by C-G formula based on Cr of 1). No results found for this basename: VANCOTROUGH:2,VANCOPEAK:2,VANCORANDOM:2,GENTTROUGH:2,GENTPEAK:2,GENTRANDOM:2,TOBRATROUGH:2,TOBRAPEAK:2,TOBRARND:2,AMIKACINPEAK:2,AMIKACINTROU:2,AMIKACIN:2, in the last 72 hours   Microbiology: No results found for this or any previous visit (from the past 720 hour(s)).  Medical History: Past Medical History  Diagnosis Date  . Hyperlipidemia   . CHF (congestive heart failure)   . Heart attack 2009  . Arthritis   . Aortic stenosis   . Glaucoma     Medications:  Prescriptions prior to admission  Medication Sig Dispense Refill  . aspirin 81 MG EC tablet Take 81 mg by mouth daily.        . bimatoprost (LUMIGAN) 0.03 % ophthalmic solution Place 1 drop into the right eye at bedtime.        . clopidogrel (PLAVIX) 75 MG tablet Take 75 mg by mouth daily.      . furosemide (LASIX) 20 MG tablet Take 20 mg by mouth daily.        Marland Kitchen gabapentin (NEURONTIN) 600 MG tablet Take 600 mg by mouth 3 (three) times daily.      Marland Kitchen lisinopril (PRINIVIL,ZESTRIL) 2.5 MG tablet Take 2.5 mg by mouth daily.        . metoprolol succinate (TOPROL-XL) 25 MG 24 hr tablet Take 25 mg by mouth daily.      . niacin (NIASPAN) 500 MG CR tablet Take 1,000  mg by mouth at bedtime.      . simvastatin (ZOCOR) 20 MG tablet Take 20 mg by mouth at bedtime.        Marland Kitchen spironolactone (ALDACTONE) 25 MG tablet Take 25 mg by mouth at bedtime.       . nitroGLYCERIN (NITROSTAT) 0.4 MG SL tablet Place 0.4 mg under the tongue every 5 (five) minutes as needed. As needed for chest pain.       Assessment: 78 y.o. male presents with R foot cellulitis. Noted pt with PVD also needing revascularization surgery. To begin broad spectrum antibiotics for cellulitis. Afeb. Wbc wnl. Blood culture pending. Vanco 1gm IV given in ED ~1704  Goal of Therapy:  Vancomycin trough level 10-15 mcg/ml  Plan:  1. Zosyn 3.375gm IV q8h. Each dose over 4 hours 2. Vancomycin 1250mg  IV q24h. Next dose tomorrow at noon. 3. Will f/u microbiological data, renal function, and pt's clinical condition  Christoper Fabian, PharmD, BCPS Clinical pharmacist, pager [phone] 09/24/2011,6:15 PM

## 2011-09-24 NOTE — ED Notes (Signed)
Dopplered bilateral DPP and marked with Xs and the flow is intermittent

## 2011-09-25 DIAGNOSIS — F1010 Alcohol abuse, uncomplicated: Secondary | ICD-10-CM

## 2011-09-25 LAB — BASIC METABOLIC PANEL
BUN: 11 mg/dL (ref 6–23)
Calcium: 9.2 mg/dL (ref 8.4–10.5)
GFR calc Af Amer: 75 mL/min — ABNORMAL LOW (ref >90)
GFR calc non Af Amer: 65 mL/min — ABNORMAL LOW (ref >90)
Glucose, Bld: 88 mg/dL (ref 70–99)
Potassium: 3.9 mEq/L (ref 3.5–5.1)
Sodium: 132 mEq/L — ABNORMAL LOW (ref 135–145)

## 2011-09-25 LAB — CBC
HCT: 33 % — ABNORMAL LOW (ref 39.0–52.0)
MCH: 31.7 pg (ref 26.0–34.0)
MCHC: 34.2 g/dL (ref 30.0–36.0)
RDW: 14.8 % (ref 11.5–15.5)

## 2011-09-25 MED ORDER — POLYVINYL ALCOHOL 1.4 % OP SOLN
2.0000 [drp] | OPHTHALMIC | Status: DC | PRN
  Administered 2011-09-26: 2 [drp] via OPHTHALMIC
  Filled 2011-09-25: qty 15

## 2011-09-25 MED ORDER — SODIUM CHLORIDE 0.9 % IV SOLN
INTRAVENOUS | Status: DC
  Administered 2011-09-25 – 2011-09-27 (×4): via INTRAVENOUS

## 2011-09-25 NOTE — Progress Notes (Signed)
[facility]             Progress Note   Subjective: No complaints/events. Less pain to bilateral legs today.  Objective: Vital signs in last 24 hours: Temp:  [97.9 F (36.6 C)-98.7 F (37.1 C)] 97.9 F (36.6 C) (09/02 0520) Pulse Rate:  [82-95] 84  (09/02 0520) Resp:  [16-20] 17  (09/02 0520) BP: (108-136)/(61-77) 108/77 mmHg (09/02 0520) SpO2:  [95 %-98 %] 96 % (09/02 0520) Weight:  [68.04 kg (150 lb)-70 kg (154 lb 5.2 oz)] 68.04 kg (150 lb) (09/01 1800) Weight change:  Last BM Date: 09/24/11  Intake/Output from previous day: 09/01 0701 - 09/02 0700 In: -  Out: 150 [Urine:150]     Physical Exam: General: Alert, awake, oriented x3, in no acute distress. HEENT: No bruits, no goiter. Heart: Regular rate and rhythm, without murmurs, rubs, gallops. Lungs: Clear to auscultation bilaterally. Abdomen: Soft, nontender, nondistended, positive bowel sounds. Extremities: He has bilateral onychomycosis. On the left he has no edema, on the right he has redness to the dorsum of his foot and to the first 4 toes, he has maceration and weeping in between the toes as well. I am unable to palpate any popliteal or foot pulses.  Neuro: Grossly intact, nonfocal.    Lab Results: Basic Metabolic Panel:  Basename 09/25/11 0459 09/24/11 1823 09/24/11 1500  NA 132* -- 136  K 3.9 -- 4.2  CL 98 -- 100  CO2 25 -- --  GLUCOSE 88 -- 103*  BUN 11 -- 6  CREATININE 1.07 0.81 --  CALCIUM 9.2 -- --  MG -- -- --  PHOS -- -- --   CBC:  Basename 09/25/11 0459 09/24/11 1823  WBC 7.4 9.1  NEUTROABS -- --  HGB 11.3* 12.1*  HCT 33.0* 35.6*  MCV 92.4 93.2  PLT 302 328    Studies/Results: No results found.  Medications: Scheduled Meds:   . aspirin EC  81 mg Oral Daily  . bimatoprost  1 drop Right Eye QHS  . clopidogrel  75 mg Oral QAC breakfast  . enoxaparin (LOVENOX) injection  40 mg Subcutaneous Q24H  . folic acid  1 mg Oral Daily  . furosemide  20 mg Oral Daily  .  gabapentin  600 mg Oral TID  . lisinopril  2.5 mg Oral Daily  . metoprolol succinate  25 mg Oral Daily  . niacin  1,000 mg Oral QHS  . nicotine  21 mg Transdermal Daily  . piperacillin-tazobactam (ZOSYN)  IV  3.375 g Intravenous Q8H  . pneumococcal 23 valent vaccine  0.5 mL Intramuscular Tomorrow-1000  . simvastatin  20 mg Oral QHS  . sodium chloride  3 mL Intravenous Q12H  . spironolactone  25 mg Oral QHS  . thiamine  100 mg Oral Daily  . vancomycin  1,250 mg Intravenous Q24H  . vancomycin  1,000 mg Intravenous Once  . DISCONTD: aspirin  81 mg Oral Daily  . DISCONTD: bimatoprost  1 drop Right Eye QHS  . DISCONTD: piperacillin-tazobactam (ZOSYN)  IV  3.375 g Intravenous Once   Continuous Infusions:  PRN Meds:.sodium chloride, acetaminophen, acetaminophen, morphine injection, nitroGLYCERIN, ondansetron (ZOFRAN) IV, ondansetron, oxyCODONE, senna-docusate, sodium chloride  Assessment/Plan:  Principal Problem:  *PVD (peripheral vascular disease) with claudication Active Problems:  Cellulitis of right foot  HTN (hypertension)  Hyperlipidemia  Tobacco abuse  CHF, chronic  Alcohol abuse  #1 peripheral vascular disease: He is noted to have complete occlusion of the right common iliac artery and almost  complete occlusion of the left on prior CT. Plan as per vascular surgery.   #2 cellulitis of the right foot: We'll start him on IV vancomycin and Zosyn. Blood cultures have been ordered. Patient is afebrile and nontoxic.   #3 tobacco abuse: We'll place a nicotine patch while in the hospital, he has been counseled however is not willing to quit.   #4 history of alcohol abuse: We'll place on thiamine and folate while in the hospital.   #5 CHF, chronic, type unknown: From his medications would appear that he has systolic dysfunction however I am unable to locate an echo. Patient is from [place] and this is where he receives all of his medical care. We'll order a 2-D echo as he will most  likely need this for preop clearance if we decide to proceed with revascularization procedure this hospitalization.   #6 DVT prophylaxis: Lovenox.   #7 CODE STATUS: This has been discussed with patient and he desires to BE a full code at this time.   LOS: 1 day   [facility] Pager: [phone] 09/25/2011, 8:46 AM

## 2011-09-25 NOTE — Progress Notes (Addendum)
[facility]  Daily Progress Note  Assessment/Planning: BLE CLI, AS   Preop w/u in progress: cardio c/s tomorrow, hopefully echo tomorrow  Legs look better today  Will try to arrange Ao, BRo tomorrow  NS @ 100 cc/hr to help limit CIN  Subjective    Pain is better  Objective Filed Vitals:   09/24/11 1732 09/24/11 1800 09/24/11 2215 09/25/11 0520  BP: 133/66 122/61 136/64 108/77  Pulse:  82 95 84  Temp: 98.2 F (36.8 C) 98.6 F (37 C) 98.6 F (37 C) 97.9 F (36.6 C)  TempSrc: Oral Oral Oral Oral  Resp: 18 20 19 17   Height: 5\' 7"  (1.702 m) 5\' 7"  (1.702 m)    Weight: 154 lb 5.2 oz (70 kg) 150 lb (68.04 kg)    SpO2: 95% 98% 96% 96%    Intake/Output Summary (Last 24 hours) at 09/25/11 0933 Last data filed at 09/25/11 0500  Gross per 24 hour  Intake      0 ml  Output    150 ml  Net   -150 ml    PULM  CTAB CV  RRR GI  soft, NTND VASC  Don't feel femoral pulses today, B feet warm, petechiae mostly resolved  Laboratory CBC    Component Value Date/Time   WBC 7.4 09/25/2011 0459   HGB 11.3* 09/25/2011 0459   HCT 33.0* 09/25/2011 0459   PLT 302 09/25/2011 0459    BMET    Component Value Date/Time   NA 132* 09/25/2011 0459   K 3.9 09/25/2011 0459   CL 98 09/25/2011 0459   CO2 25 09/25/2011 0459   GLUCOSE 88 09/25/2011 0459   BUN 11 09/25/2011 0459   CREATININE 1.07 09/25/2011 0459   CALCIUM 9.2 09/25/2011 0459   GFRNONAA 65* 09/25/2011 0459   GFRAA 75* 09/25/2011 0459    Leonides Sake, MD [facility] Office: [phone] Pager: [phone]  09/25/2011, 9:33 AM

## 2011-09-25 NOTE — Progress Notes (Signed)
Clinical Social Work  CSW received inappropriate referral for smoking cessation. CSW is signing off but available if any further needs arise.  [place], [place] [phone]

## 2011-09-26 ENCOUNTER — Inpatient Hospital Stay (HOSPITAL_COMMUNITY): Payer: Medicare Other

## 2011-09-26 ENCOUNTER — Encounter (HOSPITAL_COMMUNITY)
Admission: EM | Disposition: A | Discharge status: Home / Self Care | Payer: Self-pay | Source: Home / Self Care | Attending: Internal Medicine

## 2011-09-26 LAB — BASIC METABOLIC PANEL
Calcium: 9.4 mg/dL (ref 8.4–10.5)
GFR calc Af Amer: 74 mL/min — ABNORMAL LOW (ref >90)
GFR calc non Af Amer: 63 mL/min — ABNORMAL LOW (ref >90)
Glucose, Bld: 99 mg/dL (ref 70–99)
Potassium: 4.6 mEq/L (ref 3.5–5.1)
Sodium: 131 mEq/L — ABNORMAL LOW (ref 135–145)

## 2011-09-26 LAB — CBC
Hemoglobin: 12.8 g/dL — ABNORMAL LOW (ref 13.0–17.0)
MCH: 31.6 pg (ref 26.0–34.0)
MCHC: 33.6 g/dL (ref 30.0–36.0)
Platelets: 336 10*3/uL (ref 150–400)

## 2011-09-26 SURGERY — ABDOMINAL AORTAGRAM
Anesthesia: Moderate Sedation

## 2011-09-26 MED ORDER — IOHEXOL 350 MG/ML SOLN
100.0000 mL | Freq: Once | INTRAVENOUS | Status: AC | PRN
  Administered 2011-09-26: 100 mL via INTRAVENOUS

## 2011-09-26 NOTE — Progress Notes (Signed)
Subjective: Interval History: none..   Objective: Vital signs in last 24 hours: Temp:  [98 F (36.7 C)-99.5 F (37.5 C)] 98 F (36.7 C) (09/03 0602) Pulse Rate:  [82-92] 92  (09/03 0602) Resp:  [18] 18  (09/03 0602) BP: (116-118)/(51-76) 116/76 mmHg (09/03 0602) SpO2:  [96 %-98 %] 96 % (09/03 0602)  Intake/Output from previous day: 09/02 0701 - 09/03 0700 In: 720 [P.O.:720] Out: 2350 [Urine:2350] Intake/Output this shift:    Erythema over the dorsum of both feet right greater than left. No specific tissue loss. No femoral pulses. Palpable brachial pulses.  Lab Results:  Basename 09/26/11 0610 09/25/11 0459  WBC 9.5 7.4  HGB 12.8* 11.3*  HCT 38.1* 33.0*  PLT 336 302   BMET  Basename 09/26/11 0610 09/25/11 0459  NA 131* 132*  K 4.6 3.9  CL 94* 98  CO2 27 25  GLUCOSE 99 88  BUN 11 11  CREATININE 1.09 1.07  CALCIUM 9.4 9.2    Studies/Results: Ct Angio Ao+bifem W/cm &/or Wo/cm  09/26/2011  *RADIOLOGY REPORT*  Clinical Data:  Iliac stenosis  CT ANGIOGRAPHY OF ABDOMINAL AORTA WITH ILIOFEMORAL RUNOFF  Technique:  Multidetector CT imaging of the abdomen, pelvis and lower extremities was performed using the standard protocol during bolus administration of intravenous contrast.  Multiplanar CT image reconstructions including MIPs were obtained to evaluate the vascular anatomy.  Contrast: OMNIPAQUE IOHEXOL 350 MG/ML SOLN  Comparison:  08/25/2010  Findings:  Motion artifact degrades the study.  Extensive plaque in the descending thoracic aorta without significant focal narrowing.  No significant change.  Plaque in the upper abdominal aorta, at the diaphragmatic hiatus without significant narrowing.  Juxtarenal plaque in the aorta is noted.  This has slightly progressed.  There is plaque in the infrarenal aorta with severe narrowing which has progressed.  There is now near occlusion of the infrarenal aorta. Prominence of the right inferior epigastric artery supports pre occlusion  in the aorta.  This vessel has become more prominent.  Moderate narrowing at the origin of the celiac is stable.  SMA is patent.  Bilateral single renal arteries are grossly patent with mild atherosclerotic change. Severe narrowing at the origin of the IMA is stable.  Right common iliac artery remains occluded.  Right external iliac artery and internal iliac artery reconstitute and are severely diminutive with diffuse atherosclerotic change. Moderate long segment narrowing of the distal right external iliac artery.  Severe narrowing in the proximal left common iliac artery is stable.  Left internal and external iliac arteries are severely diminutive with diffuse atherosclerotic change but no significant focal stenosis.  Right common femoral artery and superficial femoral artery are diminutive and patent with atherosclerotic changes.  There are more prominent atherosclerotic changes in the distal right superficial femoral artery.  Right popliteal artery is patent.  Two-vessel runoff to the right ankle via the peroneal and posterior tibial arteries.  Left common femoral artery is patent.  Profunda femoral arteries patent.  Moderate plaque at the origin of the profunda and superficial femoral arteries.  The superficial femoral artery is diminutive and patent with mild atherosclerotic changes.  There is a significant near occlusive stenosis of the left popliteal artery above the knee joint.  This has progressed since the prior study. One vessel runoff to the left ankle via the peroneal artery.  Focal atelectasis verses airspace disease at the posterior basal segment of the left lower lobe.  Liver, gallbladder, spleen, pancreas are within normal limits. Prominence of the  adrenal glands without focal mass is stable.  Sub centimeter short axis diameter epigastric and gastrohepatic ligament nodes.  No abnormal adenopathy by measurement criteria.  Normal appendix.  No focal bowel wall thickening or evidence of bowel  obstruction.  Negative free fluid.  Prominent prostate stable.  Severe L5 S1 degenerative disc disease.  Mild anterolisthesis L4 upon L5.  No pars defect.  Severe facet arthropathy at L4-5 and L5- S1.  Foraminal stenosis at these levels is present.  It is severe on the right at L4-5.   Review of the MIP images confirms the above findings.  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  It is nearly occluded.  There is irregular plaque throughout the visualized aorta.  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  Diffuse narrowing of the distal right external iliac artery is not significantly changed.  Significant stenosis in the proximal left common iliac artery is stable.  Right lower extremity arterial structures are diffusely diminutive with two-vessel runoff.  Significant stenosis in the left popliteal artery above the knee joint has progressed with one vessel runoff to the left ankle.   Original Report Authenticated By: Donavan Burnet, M.D.    Anti-infectives: Anti-infectives     Start     Dose/Rate Route Frequency Ordered Stop   09/25/11 1200   vancomycin (VANCOCIN) 1,250 mg in sodium chloride 0.9 % 250 mL IVPB        1,250 mg 166.7 mL/hr over 90 Minutes Intravenous Every 24 hours 09/24/11 1824     09/24/11 1900  piperacillin-tazobactam (ZOSYN) IVPB 3.375 g       3.375 g 12.5 mL/hr over 240 Minutes Intravenous 3 times per day 09/24/11 1824     09/24/11 1600   vancomycin (VANCOCIN) IVPB 1000 mg/200 mL premix        1,000 mg 200 mL/hr over 60 Minutes Intravenous  Once 09/24/11 1553 09/24/11 1804   09/24/11 1600   piperacillin-tazobactam (ZOSYN) IVPB 3.375 g  Status:  Discontinued        3.375 g 12.5 mL/hr over 240 Minutes Intravenous  Once 09/24/11 1553 09/24/11 1809          Assessment/Plan: s/p Procedure(s) (LRB): ABDOMINAL AORTAGRAM (N/A) LOWER EXTREMITY ANGIOGRAM (Bilateral) Chronic lower surety arterial insufficiency  bilaterally which is severe. The patient will undergo arteriography today most likely do a brachial approach. Plans for aortobifemoral or axillofemoral bypass pending. Ongoing cardiac workup as well.   LOS: 2 days   Kaiyu Mirabal 09/26/2011, 8:20 AM

## 2011-09-26 NOTE — Progress Notes (Signed)
  Echocardiogram 2D Echocardiogram has been performed.  Gunner Iodice 09/26/2011, 2:02 PM

## 2011-09-26 NOTE — Progress Notes (Signed)
[facility]             Progress Note   Subjective: No complaints/events. Less pain to bilateral legs today.  Objective: Vital signs in last 24 hours: Temp:  [98 F (36.7 C)-99.5 F (37.5 C)] 98 F (36.7 C) (09/03 0602) Pulse Rate:  [84-92] 92  (09/03 0602) Resp:  [18] 18  (09/03 0602) BP: (116)/(51-76) 116/76 mmHg (09/03 0602) SpO2:  [96 %-98 %] 96 % (09/03 0602) Weight change:  Last BM Date: 09/24/11  Intake/Output from previous day: 09/02 0701 - 09/03 0700 In: 720 [P.O.:720] Out: 2350 [Urine:2350]     Physical Exam: General: Alert, awake, oriented x3, in no acute distress. HEENT: No bruits, no goiter. Heart: Regular rate and rhythm, without murmurs, rubs, gallops. Lungs: Clear to auscultation bilaterally. Abdomen: Soft, nontender, nondistended, positive bowel sounds. Extremities: He has bilateral onychomycosis. On the left he has no edema, on the right he has redness to the dorsum of his foot and to the first 4 toes, he has maceration and weeping in between the toes as well. I am unable to palpate any popliteal or foot pulses. Neuro: Grossly intact, nonfocal.    Lab Results: Basic Metabolic Panel:  Basename 09/26/11 0610 09/25/11 0459  NA 131* 132*  K 4.6 3.9  CL 94* 98  CO2 27 25  GLUCOSE 99 88  BUN 11 11  CREATININE 1.09 1.07  CALCIUM 9.4 9.2  MG -- --  PHOS -- --   CBC:  Basename 09/26/11 0610 09/25/11 0459  WBC 9.5 7.4  NEUTROABS -- --  HGB 12.8* 11.3*  HCT 38.1* 33.0*  MCV 94.1 92.4  PLT 336 302    Studies/Results: Ct Angio Ao+bifem W/cm &/or Wo/cm  09/26/2011  *RADIOLOGY REPORT*  Clinical Data:  Iliac stenosis  CT ANGIOGRAPHY OF ABDOMINAL AORTA WITH ILIOFEMORAL RUNOFF  Technique:  Multidetector CT imaging of the abdomen, pelvis and lower extremities was performed using the standard protocol during bolus administration of intravenous contrast.  Multiplanar CT image reconstructions including MIPs were obtained to evaluate the  vascular anatomy.  Contrast: OMNIPAQUE IOHEXOL 350 MG/ML SOLN  Comparison:  08/25/2010  Findings:  Motion artifact degrades the study.  Extensive plaque in the descending thoracic aorta without significant focal narrowing.  No significant change.  Plaque in the upper abdominal aorta, at the diaphragmatic hiatus without significant narrowing.  Juxtarenal plaque in the aorta is noted.  This has slightly progressed.  There is plaque in the infrarenal aorta with severe narrowing which has progressed.  There is now near occlusion of the infrarenal aorta. Prominence of the right inferior epigastric artery supports pre occlusion in the aorta.  This vessel has become more prominent.  Moderate narrowing at the origin of the celiac is stable.  SMA is patent.  Bilateral single renal arteries are grossly patent with mild atherosclerotic change. Severe narrowing at the origin of the IMA is stable.  Right common iliac artery remains occluded.  Right external iliac artery and internal iliac artery reconstitute and are severely diminutive with diffuse atherosclerotic change. Moderate long segment narrowing of the distal right external iliac artery.  Severe narrowing in the proximal left common iliac artery is stable.  Left internal and external iliac arteries are severely diminutive with diffuse atherosclerotic change but no significant focal stenosis.  Right common femoral artery and superficial femoral artery are diminutive and patent with atherosclerotic changes.  There are more prominent atherosclerotic changes in the distal right superficial femoral artery.  Right popliteal  artery is patent.  Two-vessel runoff to the right ankle via the peroneal and posterior tibial arteries.  Left common femoral artery is patent.  Profunda femoral arteries patent.  Moderate plaque at the origin of the profunda and superficial femoral arteries.  The superficial femoral artery is diminutive and patent with mild atherosclerotic changes.   There is a significant near occlusive stenosis of the left popliteal artery above the knee joint.  This has progressed since the prior study. One vessel runoff to the left ankle via the peroneal artery.  Focal atelectasis verses airspace disease at the posterior basal segment of the left lower lobe.  Liver, gallbladder, spleen, pancreas are within normal limits. Prominence of the adrenal glands without focal mass is stable.  Sub centimeter short axis diameter epigastric and gastrohepatic ligament nodes.  No abnormal adenopathy by measurement criteria.  Normal appendix.  No focal bowel wall thickening or evidence of bowel obstruction.  Negative free fluid.  Prominent prostate stable.  Severe L5 S1 degenerative disc disease.  Mild anterolisthesis L4 upon L5.  No pars defect.  Severe facet arthropathy at L4-5 and L5- S1.  Foraminal stenosis at these levels is present.  It is severe on the right at L4-5.   Review of the MIP images confirms the above findings.  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  It is nearly occluded.  There is irregular plaque throughout the visualized aorta.  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  Diffuse narrowing of the distal right external iliac artery is not significantly changed.  Significant stenosis in the proximal left common iliac artery is stable.  Right lower extremity arterial structures are diffusely diminutive with two-vessel runoff.  Significant stenosis in the left popliteal artery above the knee joint has progressed with one vessel runoff to the left ankle.   Original Report Authenticated By: Donavan Burnet, M.D.     Medications: Scheduled Meds:    . aspirin EC  81 mg Oral Daily  . bimatoprost  1 drop Right Eye QHS  . clopidogrel  75 mg Oral QAC breakfast  . enoxaparin (LOVENOX) injection  40 mg Subcutaneous Q24H  . folic acid  1 mg Oral Daily  . furosemide  20 mg Oral Daily  . gabapentin  600 mg  Oral TID  . lisinopril  2.5 mg Oral Daily  . metoprolol succinate  25 mg Oral Daily  . niacin  1,000 mg Oral QHS  . nicotine  21 mg Transdermal Daily  . piperacillin-tazobactam (ZOSYN)  IV  3.375 g Intravenous Q8H  . pneumococcal 23 valent vaccine  0.5 mL Intramuscular Tomorrow-1000  . simvastatin  20 mg Oral QHS  . sodium chloride  3 mL Intravenous Q12H  . spironolactone  25 mg Oral QHS  . thiamine  100 mg Oral Daily  . vancomycin  1,250 mg Intravenous Q24H   Continuous Infusions:    . sodium chloride 100 mL/hr at 09/26/11 0901   PRN Meds:.sodium chloride, acetaminophen, acetaminophen, iohexol, morphine injection, nitroGLYCERIN, ondansetron (ZOFRAN) IV, ondansetron, oxyCODONE, polyvinyl alcohol, senna-docusate, sodium chloride  Assessment/Plan:  Principal Problem:  *PVD (peripheral vascular disease) with claudication Active Problems:  Cellulitis of right foot  HTN (hypertension)  Hyperlipidemia  Tobacco abuse  CHF, chronic  Alcohol abuse  #1 peripheral vascular disease: For aortogram today. Plan as per vascular surgery.  #2 cellulitis of the right foot:  On IV vancomycin and Zosyn. Blood cultures have been ordered. Patient is afebrile and  nontoxic.   #3 tobacco abuse: We'll place a nicotine patch while in the hospital, he has been counseled however is not willing to quit.   #4 history of alcohol abuse: We'll place on thiamine and folate while in the hospital.   #5 CHF, chronic, type unknown: From his medications would appear that he has systolic dysfunction however I am unable to locate an echo. Patient is from [place] and this is where he receives all of his medical care. We'll order a 2-D echo as he will most likely need this for preop clearance if we decide to proceed with revascularization procedure this hospitalization.   #6 DVT prophylaxis: Lovenox.   #7 CODE STATUS: This has been discussed with patient and he desires to BE a full code at this time.   LOS: 2 days    [facility] Pager: [phone] 09/26/2011, 9:25 AM

## 2011-09-27 ENCOUNTER — Encounter (HOSPITAL_COMMUNITY): Payer: Self-pay | Admitting: Cardiology

## 2011-09-27 DIAGNOSIS — I739 Peripheral vascular disease, unspecified: Secondary | ICD-10-CM

## 2011-09-27 DIAGNOSIS — Z01810 Encounter for preprocedural cardiovascular examination: Secondary | ICD-10-CM

## 2011-09-27 DIAGNOSIS — L0291 Cutaneous abscess, unspecified: Secondary | ICD-10-CM

## 2011-09-27 MED ORDER — REGADENOSON 0.4 MG/5ML IV SOLN
0.4000 mg | Freq: Once | INTRAVENOUS | Status: AC
  Administered 2011-09-28: 0.4 mg via INTRAVENOUS
  Filled 2011-09-27: qty 5

## 2011-09-27 MED ORDER — METOPROLOL SUCCINATE ER 25 MG PO TB24
25.0000 mg | ORAL_TABLET | Freq: Every day | ORAL | Status: DC
Start: 2011-09-28 — End: 2011-09-29
  Administered 2011-09-28 – 2011-09-29 (×2): 25 mg via ORAL
  Filled 2011-09-27 (×2): qty 1

## 2011-09-27 NOTE — Progress Notes (Addendum)
[facility]             Progress Note   Subjective: No complaints/events.  Pain tolerable and controlled with pain medications. Concerned when his surgery is scheduled for.   Objective: Vital signs in last 24 hours: Temp:  [98.1 F (36.7 C)-98.6 F (37 C)] 98.1 F (36.7 C) (09/04 0538) Pulse Rate:  [81-93] 81  (09/04 0538) Resp:  [18-20] 18  (09/04 0538) BP: (100-116)/(55-85) 116/85 mmHg (09/04 0538) SpO2:  [94 %-98 %] 98 % (09/04 0538) Weight change:  Last BM Date: 09/24/11  Intake/Output from previous day: 09/03 0701 - 09/04 0700 In: 760 [P.O.:360; I.V.:400] Out: 525 [Urine:525]     Physical Exam: General: Alert, awake, oriented x3, in no acute distress. HEENT: No bruits, no goiter. Heart: Regular rate and rhythm, without murmurs, rubs, gallops. Lungs: Clear to auscultation bilaterally. Abdomen: Soft, nontender, nondistended, positive bowel sounds. Extremities: He has bilateral onychomycosis. On the left he has no edema, on the right he has redness to the dorsum of his foot and to the first 4 toes, he has maceration and weeping in between the toes as well. I am unable to palpate any popliteal or foot pulses. Neuro: Grossly intact, nonfocal.    Lab Results: Basic Metabolic Panel:  Basename 09/26/11 0610 09/25/11 0459  NA 131* 132*  K 4.6 3.9  CL 94* 98  CO2 27 25  GLUCOSE 99 88  BUN 11 11  CREATININE 1.09 1.07  CALCIUM 9.4 9.2  MG -- --  PHOS -- --   CBC:  Basename 09/26/11 0610 09/25/11 0459  WBC 9.5 7.4  NEUTROABS -- --  HGB 12.8* 11.3*  HCT 38.1* 33.0*  MCV 94.1 92.4  PLT 336 302    Studies/Results: Ct Angio Ao+bifem W/cm &/or Wo/cm  09/26/2011  *RADIOLOGY REPORT*  Clinical Data:  Iliac stenosis  CT ANGIOGRAPHY OF ABDOMINAL AORTA WITH ILIOFEMORAL RUNOFF  Technique:  Multidetector CT imaging of the abdomen, pelvis and lower extremities was performed using the standard protocol during bolus administration of intravenous contrast.   Multiplanar CT image reconstructions including MIPs were obtained to evaluate the vascular anatomy.  Contrast: OMNIPAQUE IOHEXOL 350 MG/ML SOLN  Comparison:  08/25/2010  Findings:  Motion artifact degrades the study.  Extensive plaque in the descending thoracic aorta without significant focal narrowing.  No significant change.  Plaque in the upper abdominal aorta, at the diaphragmatic hiatus without significant narrowing.  Juxtarenal plaque in the aorta is noted.  This has slightly progressed.  There is plaque in the infrarenal aorta with severe narrowing which has progressed.  There is now near occlusion of the infrarenal aorta. Prominence of the right inferior epigastric artery supports pre occlusion in the aorta.  This vessel has become more prominent.  Moderate narrowing at the origin of the celiac is stable.  SMA is patent.  Bilateral single renal arteries are grossly patent with mild atherosclerotic change. Severe narrowing at the origin of the IMA is stable.  Right common iliac artery remains occluded.  Right external iliac artery and internal iliac artery reconstitute and are severely diminutive with diffuse atherosclerotic change. Moderate long segment narrowing of the distal right external iliac artery.  Severe narrowing in the proximal left common iliac artery is stable.  Left internal and external iliac arteries are severely diminutive with diffuse atherosclerotic change but no significant focal stenosis.  Right common femoral artery and superficial femoral artery are diminutive and patent with atherosclerotic changes.  There are more prominent atherosclerotic  changes in the distal right superficial femoral artery.  Right popliteal artery is patent.  Two-vessel runoff to the right ankle via the peroneal and posterior tibial arteries.  Left common femoral artery is patent.  Profunda femoral arteries patent.  Moderate plaque at the origin of the profunda and superficial femoral arteries.  The  superficial femoral artery is diminutive and patent with mild atherosclerotic changes.  There is a significant near occlusive stenosis of the left popliteal artery above the knee joint.  This has progressed since the prior study. One vessel runoff to the left ankle via the peroneal artery.  Focal atelectasis verses airspace disease at the posterior basal segment of the left lower lobe.  Liver, gallbladder, spleen, pancreas are within normal limits. Prominence of the adrenal glands without focal mass is stable.  Sub centimeter short axis diameter epigastric and gastrohepatic ligament nodes.  No abnormal adenopathy by measurement criteria.  Normal appendix.  No focal bowel wall thickening or evidence of bowel obstruction.  Negative free fluid.  Prominent prostate stable.  Severe L5 S1 degenerative disc disease.  Mild anterolisthesis L4 upon L5.  No pars defect.  Severe facet arthropathy at L4-5 and L5- S1.  Foraminal stenosis at these levels is present.  It is severe on the right at L4-5.   Review of the MIP images confirms the above findings.  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  It is nearly occluded.  There is irregular plaque throughout the visualized aorta.  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  Diffuse narrowing of the distal right external iliac artery is not significantly changed.  Significant stenosis in the proximal left common iliac artery is stable.  Right lower extremity arterial structures are diffusely diminutive with two-vessel runoff.  Significant stenosis in the left popliteal artery above the knee joint has progressed with one vessel runoff to the left ankle.   Original Report Authenticated By: Donavan Burnet, M.D.     Medications: Scheduled Meds:    . aspirin EC  81 mg Oral Daily  . bimatoprost  1 drop Right Eye QHS  . clopidogrel  75 mg Oral QAC breakfast  . enoxaparin (LOVENOX) injection  40 mg Subcutaneous Q24H    . folic acid  1 mg Oral Daily  . furosemide  20 mg Oral Daily  . gabapentin  600 mg Oral TID  . lisinopril  2.5 mg Oral Daily  . metoprolol succinate  25 mg Oral Daily  . niacin  1,000 mg Oral QHS  . nicotine  21 mg Transdermal Daily  . piperacillin-tazobactam (ZOSYN)  IV  3.375 g Intravenous Q8H  . simvastatin  20 mg Oral QHS  . sodium chloride  3 mL Intravenous Q12H  . spironolactone  25 mg Oral QHS  . thiamine  100 mg Oral Daily  . vancomycin  1,250 mg Intravenous Q24H   Continuous Infusions:    . sodium chloride 100 mL/hr at 09/27/11 0538   PRN Meds:.sodium chloride, acetaminophen, acetaminophen, morphine injection, nitroGLYCERIN, ondansetron (ZOFRAN) IV, ondansetron, oxyCODONE, polyvinyl alcohol, senna-docusate, sodium chloride  Assessment/Plan:  Principal Problem:  *PVD (peripheral vascular disease) with claudication Active Problems:  Cellulitis of right foot  HTN (hypertension)  Hyperlipidemia  Tobacco abuse  CHF, chronic  Alcohol abuse  #1 peripheral vascular disease: Further recommendations as per vascular surgery. Pain control as needed.  He will need aorto bi femoral bypass . 2 D echo showed good LV systolic function, with grade 1  diastolic dysfunction., mild LVH, mild concentric hypertrophy of the  Left ventricle and no significant valvular abnormalities.   #2 cellulitis of the right foot:  On IV vancomycin and Zosyn. Blood cultures negative so far.  Patient is afebrile and nontoxic.   #3 tobacco abuse: We'll place a nicotine patch while in the hospital, he has been counseled however is not willing to quit.   #4 history of alcohol abuse: We'll place on thiamine and folate while in the hospital.   #5 CHF, chronic, type unknown: ? Diastolic dysfunction, 2-D echo here shows good LV systolic function, with grade 1 diastolic dysfunction., mild LVH, mild concentric hypertrophy of the  Left ventricle and no significant valvular abnormalities.  Plan for  revascularization procedure this hospitalization.   #6 DVT prophylaxis: Lovenox.   #7 CODE STATUS: This has been discussed with patient and he desires to BE a full code at this time.  #8 hyponatremia: possibly secondary to lasix. Stop the lasix and IV fluids.  He doesn't appear to be in fluid over load. Repeat labs in am.   LOS: 3 days   Nathaniel Curtis [facility] Pager: [phone] 09/27/2011, 8:58 AM

## 2011-09-27 NOTE — Progress Notes (Signed)
ANTIBIOTIC CONSULT NOTE - FOLLOW UP  Pharmacy Consult for Vancomycin, Zosyn Indication: RLE Cellulitis, vascular insufficiency  Allergies  Allergen Reactions  . Lyrica (Pregabalin) Swelling    Swelling in feet and ankles.    Patient Measurements: Height: 5\' 7"  (170.2 cm) Weight: 150 lb (68.04 kg) IBW/kg (Calculated) : 66.1   Vital Signs: Temp: 98.1 F (36.7 C) (09/04 0538) Temp src: Oral (09/04 0538) BP: 116/85 mmHg (09/04 0538) Pulse Rate: 81  (09/04 0538) Intake/Output from previous day: 09/03 0701 - 09/04 0700 In: 760 [P.O.:360; I.V.:400] Out: 525 [Urine:525] Intake/Output from this shift:    Labs:  Basename 09/26/11 0610 09/25/11 0459 09/24/11 1823  WBC 9.5 7.4 9.1  HGB 12.8* 11.3* 12.1*  PLT 336 302 328  LABCREA -- -- --  CREATININE 1.09 1.07 0.81   Estimated Creatinine Clearance: 53.1 ml/min (by C-G formula based on Cr of 1.09). No results found for this basename: VANCOTROUGH:2,VANCOPEAK:2,VANCORANDOM:2,GENTTROUGH:2,GENTPEAK:2,GENTRANDOM:2,TOBRATROUGH:2,TOBRAPEAK:2,TOBRARND:2,AMIKACINPEAK:2,AMIKACINTROU:2,AMIKACIN:2, in the last 72 hours   Assessment: Nathaniel Curtis continues on Vanc and Zosyn empirically for RLE cellulitis. Noted patient has severe vascular insufficiency, and there are plans for peripheral bypass surgery.   Anticoagulation: Asa, plavix PTA, contionued  Infectious Disease: Vanco/Zosyn for R foot cellulitis. Afeb. WBC WNL. Sr remains stable and WNL. Est pk: 35/est trough 11.2 based in current regimen.  Vanco 9/1>> Zosyn 9/1>>  9/1 Bld x 2>>ngtd  Cardiovascular: HL, HF. 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 EF 50-55% per ECHO. On home meds. VSS.  Gastrointestinal / Nutrition: FA, thiamine. On cardiac diet. No LFTs this admit  Neurology: Alcohol abuse. Home gabapentin, Nicotine patch  Nephrology: Scr 1.07.  UOP not accurately recorded.  Pulmonary: smoker  Hematology / Oncology: H/H and plts ok  PTA Medication Issues:Home meds addressed  Best Practices: LMWH   Goal of Therapy:  Vancomycin trough level 10-15 mcg/ml  Plan:  1. Continue Zosyn 3.375gm IV q8h. Each dose over 4 hours 2. Continue Vancomycin 1250mg  IV q24h. Will plan to check Css trough on Friday 3. Will f/u microbiological data, renal function, and pt's clinical condition along with you daily.  Thanks, Nathaniel Curtis, PharmD, BCPS.  Clinical Pharmacist Pager [phone]. 09/27/2011 7:47 AM

## 2011-09-27 NOTE — Progress Notes (Signed)
Subjective: Interval History: none..   Objective: Vital signs in last 24 hours: Temp:  [98.1 F (36.7 C)-98.6 F (37 C)] 98.1 F (36.7 C) (09/04 1300) Pulse Rate:  [81-93] 83  (09/04 1300) Resp:  [16-20] 16  (09/04 1300) BP: (100-116)/(55-85) 115/60 mmHg (09/04 1300) SpO2:  [94 %-98 %] 97 % (09/04 1300)  Intake/Output from previous day: 09/03 0701 - 09/04 0700 In: 760 [P.O.:360; I.V.:400] Out: 525 [Urine:525] Intake/Output this shift: Total I/O In: -  Out: 600 [Urine:600]  No change in foot exam.  Lab Results:  [facility] 09/26/11 0610 09/25/11 0459  WBC 9.5 7.4  HGB 12.8* 11.3*  HCT 38.1* 33.0*  PLT 336 302   BMET  Basename 09/26/11 0610 09/25/11 0459  NA 131* 132*  K 4.6 3.9  CL 94* 98  CO2 27 25  GLUCOSE 99 88  BUN 11 11  CREATININE 1.09 1.07  CALCIUM 9.4 9.2    Studies/Results: Ct Angio Ao+bifem W/cm &/or Wo/cm  09/26/2011  *RADIOLOGY REPORT*  Clinical Data:  Iliac stenosis  CT ANGIOGRAPHY OF ABDOMINAL AORTA WITH ILIOFEMORAL RUNOFF  Technique:  Multidetector CT imaging of the abdomen, pelvis and lower extremities was performed using the standard protocol during bolus administration of intravenous contrast.  Multiplanar CT image reconstructions including MIPs were obtained to evaluate the vascular anatomy.  Contrast: OMNIPAQUE IOHEXOL 350 MG/ML SOLN  Comparison:  08/25/2010  Findings:  Motion artifact degrades the study.  Extensive plaque in the descending thoracic aorta without significant focal narrowing.  No significant change.  Plaque in the upper abdominal aorta, at the diaphragmatic hiatus without significant narrowing.  Juxtarenal plaque in the aorta is noted.  This has slightly progressed.  There is plaque in the infrarenal aorta with severe narrowing which has progressed.  There is now near occlusion of the infrarenal aorta. Prominence of the right inferior epigastric artery supports pre occlusion in the aorta.  This vessel has become more prominent.   Moderate narrowing at the origin of the celiac is stable.  SMA is patent.  Bilateral single renal arteries are grossly patent with mild atherosclerotic change. Severe narrowing at the origin of the IMA is stable.  Right common iliac artery remains occluded.  Right external iliac artery and internal iliac artery reconstitute and are severely diminutive with diffuse atherosclerotic change. Moderate long segment narrowing of the distal right external iliac artery.  Severe narrowing in the proximal left common iliac artery is stable.  Left internal and external iliac arteries are severely diminutive with diffuse atherosclerotic change but no significant focal stenosis.  Right common femoral artery and superficial femoral artery are diminutive and patent with atherosclerotic changes.  There are more prominent atherosclerotic changes in the distal right superficial femoral artery.  Right popliteal artery is patent.  Two-vessel runoff to the right ankle via the peroneal and posterior tibial arteries.  Left common femoral artery is patent.  Profunda femoral arteries patent.  Moderate plaque at the origin of the profunda and superficial femoral arteries.  The superficial femoral artery is diminutive and patent with mild atherosclerotic changes.  There is a significant near occlusive stenosis of the left popliteal artery above the knee joint.  This has progressed since the prior study. One vessel runoff to the left ankle via the peroneal artery.  Focal atelectasis verses airspace disease at the posterior basal segment of the left lower lobe.  Liver, gallbladder, spleen, pancreas are within normal limits. Prominence of the adrenal glands without focal mass is stable.  Sub  centimeter short axis diameter epigastric and gastrohepatic ligament nodes.  No abnormal adenopathy by measurement criteria.  Normal appendix.  No focal bowel wall thickening or evidence of bowel obstruction.  Negative free fluid.  Prominent prostate stable.   Severe L5 S1 degenerative disc disease.  Mild anterolisthesis L4 upon L5.  No pars defect.  Severe facet arthropathy at L4-5 and L5- S1.  Foraminal stenosis at these levels is present.  It is severe on the right at L4-5.   Review of the MIP images confirms the above findings.  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  It is nearly occluded.  There is irregular plaque throughout the visualized aorta.  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  Diffuse narrowing of the distal right external iliac artery is not significantly changed.  Significant stenosis in the proximal left common iliac artery is stable.  Right lower extremity arterial structures are diffusely diminutive with two-vessel runoff.  Significant stenosis in the left popliteal artery above the knee joint has progressed with one vessel runoff to the left ankle.   Original Report Authenticated By: Donavan Burnet, M.D.    Anti-infectives: Anti-infectives     Start     Dose/Rate Route Frequency Ordered Stop   09/25/11 1200   vancomycin (VANCOCIN) 1,250 mg in sodium chloride 0.9 % 250 mL IVPB        1,250 mg 166.7 mL/hr over 90 Minutes Intravenous Every 24 hours 09/24/11 1824     09/24/11 1900   piperacillin-tazobactam (ZOSYN) IVPB 3.375 g        3.375 g 12.5 mL/hr over 240 Minutes Intravenous 3 times per day 09/24/11 1824     09/24/11 1600   vancomycin (VANCOCIN) IVPB 1000 mg/200 mL premix        1,000 mg 200 mL/hr over 60 Minutes Intravenous  Once 09/24/11 1553 09/24/11 1804   09/24/11 1600   piperacillin-tazobactam (ZOSYN) IVPB 3.375 g  Status:  Discontinued        3.375 g 12.5 mL/hr over 240 Minutes Intravenous  Once 09/24/11 1553 09/24/11 1809          Assessment/Plan: s/p Procedure(s) (LRB) with comments: ABDOMINAL AORTAGRAM (N/A) LOWER EXTREMITY ANGIOGRAM (Bilateral) I discussed the CT angiogram with the patient. 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 He does reconstitute his femoral arteries bilaterally. I did explain the preferred treatment is aortobifemoral bypass. Also explained that he is at prohibitive cardiac risk the axillary to femoral bypass would be an alternative. His 2-D echocardiogram shows relatively good heart function. We have consulted cardiology for preop cardiac clearance. I did explain that this could be done with the readmission since he does remain stable with chronic ischemia. We will discuss this further following cardiac evaluation   LOS: 3 days   EARLY, TODD 09/27/2011, 4:01 PM

## 2011-09-27 NOTE — Consult Note (Signed)
CARDIOLOGY CONSULT NOTE  Patient ID: Nathaniel Curtis, MRN: [id number], DOB/AGE: 1931/03/28 78 y.o. Admit date: 09/24/2011 Date of Consult: 09/27/2011  Primary Physician: Junious Dresser, MD Primary Cardiologist: Dr. Bing Matter in [place]  Reason for Consultation: Preoperative Clearance for aortobifemoral bypass  HPI: 78 y.o. w/ PMHx significant for MI by EKG 2009 (no cath), Chronic combined systolic and diastolic CHF (EF 08% by echo 2012), HLD, Tobacco abuse, ETOH abuse, and PVD (severe iliac disease) who is requiring preop clearance for aortobifemoral bypass in the setting of BLE chronic critical limb ischemia.   Patient reports having a "small heart attack" in 2009 that was discovered by EKG at a routine office visit. He had no symptoms and has not had ischemic evaluation with cardiac cath or stress test. He denies having history of heart failure or valvular abnormalities, however, he is on heart failure medications and states his heart function is "better". Echo from 10/17/11 revealed nl LV size, mild systolic dysfunction, EF 50%, pseudonormal diastolic function, mild MR, nl RV size and function. He was evaluated for his PVD by Dr. Arbie Cookey in 07/2010 at which time revascularization surgery was recommended, however, he was lost to follow up.   Patient presented to [facility] ED on 09/24/11 for right foot pain. He was placed on antibiotics and admitted by the medicine service for right foot cellulitis and severe PVD. ABIs revealed severe reduction in arterial blood flow with abnormal waveforms. Vascular surgery has evaluated him and recommends aortobifemoral bypass surgery (in the next few weeks per the patient). Echo completed on 09/26/11 revealed mild LVH, EF 50-55%, no RWMAs, grade 1 diastolic dysfunction, no significant valvular abnormalities. Cardiology is asked to evaluate his perioperative cardiac risk.  His daily activities are limited by claudication, but he is able to walk ~15ft outside a couple times  a day without chest pain or sob. Denies sob, doe, orthopnea, weight gain, edema, chest pain, palpitations, or syncope. He smokes 1ppd (>70 pack/yr history) and drinks 3 glasses of brandy per night. Denies history of alcohol withdrawal. He has no desire to quit tobacco or alcohol use.    Past Medical History  Diagnosis Date  . Hyperlipidemia   . CHF (congestive heart failure)     Echo 09/26/11 - EF 50-55%, grade 1 diastolic dysfunction, no RWMAs, no significant valvular abnls  . Heart attack 2009    found on EKG, no cath  . Arthritis   . Glaucoma   . Tobacco abuse     1ppd for 70+yrs  . PVD (peripheral vascular disease)     severe iliac disease  . ETOH abuse     70+ yrs, 3 glasses of brandy/night       10/17/10 - Echo Findings: normal LV size, mild systolic dysfunction, EF 50%, pseudonormal diastolic function, mild MR, nl RV size and function  Surgical History:  Past Surgical History  Procedure Date  . Cataract extraction w/ intraocular lens implant RIGHT EYE 2009  . Cardiac catheterization 10/2007  . Eye surgery 02/09     Home Meds: Medication Sig  aspirin 81 MG EC tablet Take 81 mg by mouth daily.    bimatoprost (LUMIGAN) 0.03 % ophthalmic solution Place 1 drop into the right eye at bedtime.    clopidogrel (PLAVIX) 75 MG tablet Take 75 mg by mouth daily.  furosemide (LASIX) 20 MG tablet Take 20 mg by mouth daily.    gabapentin (NEURONTIN) 600 MG tablet Take 600 mg by mouth 3 (three) times daily.  lisinopril (PRINIVIL,ZESTRIL) 2.5  MG tablet Take 2.5 mg by mouth daily.    metoprolol succinate (TOPROL-XL) 25 MG 24 hr tablet Take 25 mg by mouth daily.  niacin (NIASPAN) 500 MG CR tablet Take 1,000 mg by mouth at bedtime.  simvastatin (ZOCOR) 20 MG tablet Take 20 mg by mouth at bedtime.    spironolactone (ALDACTONE) 25 MG tablet Take 25 mg by mouth at bedtime.   nitroGLYCERIN (NITROSTAT) 0.4 MG SL tablet Place 0.4 mg under the tongue every 5 (five) minutes as needed. As needed for  chest pain.    Inpatient Medications:   . aspirin EC  81 mg Oral Daily  . bimatoprost  1 drop Right Eye QHS  . clopidogrel  75 mg Oral QAC breakfast  . enoxaparin (LOVENOX) injection  40 mg Subcutaneous Q24H  . folic acid  1 mg Oral Daily  . gabapentin  600 mg Oral TID  . lisinopril  2.5 mg Oral Daily  . metoprolol succinate  25 mg Oral Daily  . niacin  1,000 mg Oral QHS  . nicotine  21 mg Transdermal Daily  . piperacillin-tazobactam (ZOSYN)  IV  3.375 g Intravenous Q8H  . simvastatin  20 mg Oral QHS  . sodium chloride  3 mL Intravenous Q12H  . spironolactone  25 mg Oral QHS  . thiamine  100 mg Oral Daily  . vancomycin  1,250 mg Intravenous Q24H  . DISCONTD: furosemide  20 mg Oral Daily    Allergies:  Allergies  Allergen Reactions  . Lyrica (Pregabalin) Swelling    Swelling in feet and ankles.    History   Social History  . Marital Status: Widowed    Spouse Name: N/A    Number of Children: N/A  . Years of Education: N/A   Occupational History  . Not on file.   Social History Main Topics  . Smoking status: Current Everyday Smoker -- 1.0 packs/day for 70 years    Types: Cigarettes  . Smokeless tobacco: Not on file  . Alcohol Use: Yes     70+ years, 3 glasses of brandy a night  . Drug Use: No  . Sexually Active: Not on file   Other Topics Concern  . Not on file   Social History Narrative  . No narrative on file     Family History  Problem Relation Age of Onset  . Heart disease Maternal Grandfather     MI 68yo     Review of Systems: General: negative for chills, fever, night sweats or weight changes.  Cardiovascular: negative for chest pain, shortness of breath dyspnea on exertion, edema, orthopnea, palpitations, or paroxysmal nocturnal dyspnea Dermatological: redness to bilat LE Musculoskeletal: BLE pain, bilat hip pain  Respiratory: negative for cough or wheezing Urologic: negative for hematuria Abdominal: negative for nausea, vomiting, diarrhea,  bright red blood per rectum, melena, or hematemesis Neurologic: negative for visual changes, syncope, or dizziness All other systems reviewed and are otherwise negative except as noted above.  Labs:  Component Value Date   WBC 9.5 09/26/2011   HGB 12.8* 09/26/2011   HCT 38.1* 09/26/2011   MCV 94.1 09/26/2011   PLT 336 09/26/2011    Lab 09/26/11 0610  NA 131*  K 4.6  CL 94*  CO2 27  BUN 11  CREATININE 1.09  CALCIUM 9.4  GLUCOSE 99   Radiology/Studies:   09/26/11 - Echo Study Conclusions: - Left ventricle: The cavity size was normal. Wall thickness was increased in a pattern of mild LVH. There was mild  concentric hypertrophy. Systolic function was normal. The estimated ejection fraction was in the range of 50% to 55%. Although no diagnostic regional wall motion abnormality was identified, this possibility cannot be completely excluded on the basis of this study. Doppler parameters are consistent with abnormal left ventricular relaxation (grade 1 diastolic dysfunction).  - Aortic valve: Trileaflet; mildly thickened leaflets. - Mitral valve: Mildly calcified annulus. Mildly thickened leaflets .  09/26/2011 - Ct Angio Ao+bifem W/cm &/or Wo/cm Findings:  Motion artifact degrades the study.  Extensive plaque in the descending thoracic aorta without significant focal narrowing.  No significant change.  Plaque in the upper abdominal aorta, at the diaphragmatic hiatus without significant narrowing.  Juxtarenal plaque in the aorta is noted.  This has slightly progressed.  There is plaque in the infrarenal aorta with severe narrowing which has progressed.  There is now near occlusion of the infrarenal aorta. Prominence of the right inferior epigastric artery supports pre occlusion in the aorta.  This vessel has become more prominent.  Moderate narrowing at the origin of the celiac is stable.  SMA is patent.  Bilateral single renal arteries are grossly patent with mild atherosclerotic change. Severe narrowing  at the origin of the IMA is stable.  Right common iliac artery remains occluded.  Right external iliac artery and internal iliac artery reconstitute and are severely diminutive with diffuse atherosclerotic change. Moderate long segment narrowing of the distal right external iliac artery.  Severe narrowing in the proximal left common iliac artery is stable.  Left internal and external iliac arteries are severely diminutive with diffuse atherosclerotic change but no significant focal stenosis.  Right common femoral artery and superficial femoral artery are diminutive and patent with atherosclerotic changes.  There are more prominent atherosclerotic changes in the distal right superficial femoral artery.  Right popliteal artery is patent.  Two-vessel runoff to the right ankle via the peroneal and posterior tibial arteries.  Left common femoral artery is patent.  Profunda femoral arteries patent.  Moderate plaque at the origin of the profunda and superficial femoral arteries.  The superficial femoral artery is diminutive and patent with mild atherosclerotic changes.  There is a significant near occlusive stenosis of the left popliteal artery above the knee joint.  This has progressed since the prior study. One vessel runoff to the left ankle via the peroneal artery.  Focal atelectasis verses airspace disease at the posterior basal segment of the left lower lobe.  Liver, gallbladder, spleen, pancreas are within normal limits. Prominence of the adrenal glands without focal mass is stable.  Sub centimeter short axis diameter epigastric and gastrohepatic ligament nodes.  No abnormal adenopathy by measurement criteria.  Normal appendix.  No focal bowel wall thickening or evidence of bowel obstruction.  Negative free fluid.  Prominent prostate stable.  Severe L5 S1 degenerative disc disease.  Mild anterolisthesis L4 upon L5.  No pars defect.  Severe facet arthropathy at L4-5 and L5- S1.  Foraminal stenosis at these levels is  present.  It is severe on the right at L4-5.   Review of the MIP images confirms the above findings.  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  It is nearly occluded.  There is irregular plaque throughout the visualized aorta.  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  Diffuse narrowing of the distal right external iliac artery is not significantly changed.  Significant stenosis in the proximal left common iliac artery is stable.  Right lower extremity arterial  structures are diffusely diminutive with two-vessel runoff.  Significant stenosis in the left popliteal artery above the knee joint has progressed with one vessel runoff to the left ankle.      EKG: 09/24/11 @ 1606 - Sinus rhythm with 1st degree AVB 85bpm, no acute ST/T changes  Physical Exam: Blood pressure 115/60, pulse 83, temperature 98.1 F (36.7 C), temperature source Oral, resp. rate 16, height 5\' 7"  (1.702 m), weight 150 lb (68.04 kg), SpO2 97.00%. General: Disheveled, chronically ill appearing, elderly white male, in no acute distress. Head: Normocephalic, atraumatic, sclera non-icteric, no xanthomas, nares are without discharge. Poor dentition  Neck: Negative for carotid bruits. JVD not elevated. Lungs: Clear bilaterally to auscultation without wheezes, rales, or rhonchi. Breathing is unlabored. Heart: RRR with S1 S2. No murmurs, rubs, or gallops appreciated. Abdomen: Soft, non-tender, non-distended with normoactive bowel sounds.  No rebound/guarding. No obvious abdominal masses. Msk:  Strength and tone appear normal for age. Extremities:  Redness and flaking skin to bilat feet R>L. No clubbing or cyanosis. No edema.  Absent pedal pulses. Feet warm to touch. Neuro: Alert and oriented X 3. Moves all extremities spontaneously. Psych:  Responds to questions appropriately with a normal affect.   Assessment and Plan:  78 y.o. w/ PMHx significant for MI by EKG 2009 (no  cath), Chronic combined systolic and diastolic CHF (EF 91% by echo 2012), HLD, Tobacco abuse, ETOH abuse, and PVD (severe iliac disease) who is requiring preop clearance for aortobifemoral bypass in the setting of BLE chronic critical limb ischemia.  Mr. Ozburn appears to be clinically stable without anginal symptoms or signs of decompensated heart failure. Echo reveals nl LV systolic function, EF 50-55%, grade 1 diastolic dysfunction, no RWMAs or significant valvular abnormalities. He is at intermediate risk for his surgical procedure given history of MI, CHF, HLD and ongoing tobacco abuse. Based on current ACC/AHA guidelines, ischemic testing is warranted before his planned surgery.  I would recommend ischemic testing with lexiscan myoview before his surgery as well as continued medical therapy with ASA, Plavix, BB, ACE, statin, lasix, and spironolactone.    Signed, HOPE, JESSICA PA-C 09/27/2011, 3:41 PM   Patient seen and examined with [facility], PA-C. We discussed all aspects of the encounter. I agree with the assessment and plan as stated above. Pt denies anginal symptoms. ECG and echo normal. However based on risk factors and nature of surgery, I feel he is at moderate to high risk for peri-operative cardiac complications and would recommend a Lexiscan Myoview for further risk stratification. Would also check PFTs. That said, if surgery turns more urgent, we would be ok with proceeding to surgery without formal stress testing if need to preserve his limb - but this does not seem to be case at this point. Would treat with asa and statin as well.  Savon Bordonaro,MD 4:31 PM

## 2011-09-28 ENCOUNTER — Inpatient Hospital Stay (HOSPITAL_COMMUNITY): Payer: Medicare Other

## 2011-09-28 DIAGNOSIS — R079 Chest pain, unspecified: Secondary | ICD-10-CM

## 2011-09-28 LAB — BASIC METABOLIC PANEL
BUN: 9 mg/dL (ref 6–23)
Chloride: 95 mEq/L — ABNORMAL LOW (ref 96–112)
GFR calc Af Amer: 74 mL/min — ABNORMAL LOW (ref >90)
GFR calc non Af Amer: 64 mL/min — ABNORMAL LOW (ref >90)
Glucose, Bld: 94 mg/dL (ref 70–99)
Potassium: 4.4 mEq/L (ref 3.5–5.1)
Sodium: 129 mEq/L — ABNORMAL LOW (ref 135–145)

## 2011-09-28 LAB — CBC
HCT: 35.6 % — ABNORMAL LOW (ref 39.0–52.0)
Hemoglobin: 12.3 g/dL — ABNORMAL LOW (ref 13.0–17.0)
RDW: 14.7 % (ref 11.5–15.5)
WBC: 10.9 10*3/uL — ABNORMAL HIGH (ref 4.0–10.5)

## 2011-09-28 MED ORDER — TECHNETIUM TC 99M TETROFOSMIN IV KIT
30.0000 | PACK | Freq: Once | INTRAVENOUS | Status: AC | PRN
  Administered 2011-09-28: 30 via INTRAVENOUS

## 2011-09-28 MED ORDER — TECHNETIUM TC 99M TETROFOSMIN IV KIT
10.0000 | PACK | Freq: Once | INTRAVENOUS | Status: AC | PRN
  Administered 2011-09-28: 10 via INTRAVENOUS

## 2011-09-28 NOTE — Progress Notes (Signed)
[facility]             Progress Note   Subjective: No complaints/events.  Pain tolerable and controlled with pain medications. Concerned when his surgery is scheduled for.   Objective: Vital signs in last 24 hours: Temp:  [98.6 F (37 C)-99 F (37.2 C)] 98.6 F (37 C) (09/05 1454) Pulse Rate:  [81-83] 81  (09/05 1454) Resp:  [18] 18  (09/05 1454) BP: (110-133)/(55-67) 130/55 mmHg (09/05 1454) SpO2:  [95 %-100 %] 100 % (09/05 1454) Weight change:  Last BM Date: 09/24/11  Intake/Output from previous day: 09/04 0701 - 09/05 0700 In: 1320 [P.O.:940; I.V.:80; IV Piggyback:300] Out: 1550 [Urine:1550] Total I/O In: 900 [P.O.:600; IV Piggyback:300] Out: 1450 [Urine:1450]   Physical Exam: General: Alert, awake, oriented x3, in no acute distress. HEENT: No bruits, no goiter. Heart: Regular rate and rhythm, without murmurs, rubs, gallops. Lungs: Clear to auscultation bilaterally. Abdomen: Soft, nontender, nondistended, positive bowel sounds. Extremities: He has bilateral onychomycosis. On the left he has no edema, on the right he has redness to the dorsum of his foot and to the first 4 toes, he has maceration and weeping in between the toes as well. I am unable to palpate any popliteal or foot pulses. Neuro: Grossly intact, nonfocal.    Lab Results: Basic Metabolic Panel:  Basename 09/28/11 0711 09/26/11 0610  NA 129* 131*  K 4.4 4.6  CL 95* 94*  CO2 22 27  GLUCOSE 94 99  BUN 9 11  CREATININE 1.08 1.09  CALCIUM 9.5 9.4  MG -- --  PHOS -- --   CBC:  Basename 09/28/11 0711 09/26/11 0610  WBC 10.9* 9.5  NEUTROABS -- --  HGB 12.3* 12.8*  HCT 35.6* 38.1*  MCV 91.8 94.1  PLT 327 336    Studies/Results: Nm Myocar Multi W/spect W/wall Motion / Ef  09/28/2011  The Lexiscan study was done under the supervision of the [facility] cardiology staff.  There were no EKG changes of ischemia noted.  The quality of the images is fair. There is some motion artefact on  the stress images.  Perfusion images show small mid-inferior wall scar without significant peri-infarct ischemia.  The EDV is 66 ml.  The ESV is 27 ml.  The LV EF is 59%. There is mild basilar inferior wall hypokinesis.  Impression: Old inferior wall scar without significant peri-infarct ischemia.  Thomas A. Brackbill MD   Original Report Authenticated By: Heriberto Antigua     Medications: Scheduled Meds:    . aspirin EC  81 mg Oral Daily  . bimatoprost  1 drop Right Eye QHS  . clopidogrel  75 mg Oral QAC breakfast  . enoxaparin (LOVENOX) injection  40 mg Subcutaneous Q24H  . folic acid  1 mg Oral Daily  . gabapentin  600 mg Oral TID  . lisinopril  2.5 mg Oral Daily  . metoprolol succinate  25 mg Oral Daily  . niacin  1,000 mg Oral QHS  . nicotine  21 mg Transdermal Daily  . piperacillin-tazobactam (ZOSYN)  IV  3.375 g Intravenous Q8H  . regadenoson  0.4 mg Intravenous Once  . simvastatin  20 mg Oral QHS  . sodium chloride  3 mL Intravenous Q12H  . spironolactone  25 mg Oral QHS  . thiamine  100 mg Oral Daily  . vancomycin  1,250 mg Intravenous Q24H   Continuous Infusions:   PRN Meds:.sodium chloride, acetaminophen, acetaminophen, morphine injection, nitroGLYCERIN, ondansetron (ZOFRAN) IV, ondansetron, oxyCODONE, polyvinyl alcohol, senna-docusate, sodium  chloride, technetium tetrofosmin, technetium tetrofosmin  Assessment/Plan:  Principal Problem:  *PVD (peripheral vascular disease) with claudication Active Problems:  Cellulitis of right foot  HTN (hypertension)  Hyperlipidemia  Tobacco abuse  CHF, chronic  Alcohol abuse  Pre-operative cardiovascular examination  #1 peripheral vascular disease: Further recommendations as per vascular surgery. Pain control as needed.  He will need aorto bi femoral bypass . 2 D echo showed good LV systolic function, with grade 1 diastolic dysfunction., mild LVH, mild concentric hypertrophy of the  Left ventricle and no significant valvular  abnormalities.   #2 cellulitis of the right foot:  On IV vancomycin and Zosyn. Blood cultures negative so far.  Patient is afebrile and nontoxic.   #3 tobacco abuse: We'll place a nicotine patch while in the hospital, he has been counseled however is not willing to quit.   #4 history of alcohol abuse: We'll place on thiamine and folate while in the hospital.   #5 CHF, chronic, type unknown: ? Diastolic dysfunction, 2-D echo here shows good LV systolic function, with grade 1 diastolic dysfunction., mild LVH, mild concentric hypertrophy of the  Left ventricle and no significant valvular abnormalities. Pre - op clearence work up in progress.  #6 DVT prophylaxis: Lovenox.   #7 CODE STATUS: This has been discussed with patient and he desires to BE a full code at this time.  #8 hyponatremia: possibly secondary to lasix. Stop the lasix and IV fluids.  He doesn't appear to be in fluid over load. Repeat labs in am.   LOS: 4 days   Nathaniel Curtis [facility] Pager: [phone] 09/28/2011, 6:55 PM

## 2011-09-28 NOTE — Progress Notes (Signed)
Cardiology Progress Note Patient Name: Nathaniel Curtis Date of Encounter: 09/28/2011, 7:02 AM     Subjective  No overnight events. Patient denies chest pain or sob. Ready to do the stress test and go home.   Objective   Telemetry: Not on tele  Medications: . aspirin EC  81 mg Oral Daily  . bimatoprost  1 drop Right Eye QHS  . clopidogrel  75 mg Oral QAC breakfast  . enoxaparin (LOVENOX) injection  40 mg Subcutaneous Q24H  . folic acid  1 mg Oral Daily  . gabapentin  600 mg Oral TID  . lisinopril  2.5 mg Oral Daily  . metoprolol succinate  25 mg Oral Daily  . niacin  1,000 mg Oral QHS  . nicotine  21 mg Transdermal Daily  . piperacillin-tazobactam (ZOSYN)  IV  3.375 g Intravenous Q8H  . regadenoson  0.4 mg Intravenous Once  . simvastatin  20 mg Oral QHS  . sodium chloride  3 mL Intravenous Q12H  . spironolactone  25 mg Oral QHS  . thiamine  100 mg Oral Daily  . vancomycin  1,250 mg Intravenous Q24H  . DISCONTD: furosemide  20 mg Oral Daily  . DISCONTD: metoprolol succinate  25 mg Oral Daily    Physical Exam: Temp:  [98.1 F (36.7 C)-99 F (37.2 C)] 98.6 F (37 C) (09/05 0505) Pulse Rate:  [81-83] 83  (09/05 0505) Resp:  [16-18] 18  (09/05 0505) BP: (110-126)/(58-62) 126/62 mmHg (09/05 0505) SpO2:  [95 %-100 %] 100 % (09/05 0505)  General: Disheveled, chronically ill appearing, elderly white male, in no acute distress.  Head: Normocephalic, atraumatic, sclera non-icteric, no xanthomas, nares are without discharge. Poor dentition  Neck: Negative for carotid bruits. JVD not elevated.  Lungs: Clear bilaterally to auscultation without wheezes, rales, or rhonchi. Breathing is unlabored.  Heart: RRR with S1 S2. No murmurs, rubs, or gallops appreciated.  Abdomen: Soft, non-tender, non-distended with normoactive bowel sounds. No rebound/guarding. No obvious abdominal masses.  Msk: Strength and tone appear normal for age.  Extremities: Redness and flaking skin to bilat feet  R>L. No clubbing or cyanosis. No edema. Absent pedal pulses. Feet warm to touch.  Neuro: Alert and oriented X 3. Moves all extremities spontaneously.  Psych: Responds to questions appropriately with a normal affect.    Intake/Output Summary (Last 24 hours) at 09/28/11 0702 Last data filed at 09/27/11 2330  Gross per 24 hour  Intake   1320 ml  Output   1550 ml  Net   -230 ml    Labs:  [facility] 09/26/11 0610  NA 131*  K 4.6  CL 94*  CO2 27  GLUCOSE 99  BUN 11  CREATININE 1.09  CALCIUM 9.4   Basename 09/26/11 0610  WBC 9.5  HGB 12.8*  HCT 38.1*  MCV 94.1  PLT 336   Radiology/Studies:   09/26/11 - Echo  Study Conclusions: - Left ventricle: The cavity size was normal. Wall thickness was increased in a pattern of mild LVH. There was mild concentric hypertrophy. Systolic function was normal. The estimated ejection fraction was in the range of 50% to 55%. Although no diagnostic regional wall motion abnormality was identified, this possibility cannot be completely excluded on the basis of this study. Doppler parameters are consistent with abnormal left ventricular relaxation (grade 1 diastolic dysfunction).  - Aortic valve: Trileaflet; mildly thickened leaflets. - Mitral valve: Mildly calcified annulus. Mildly thickened leaflets .   09/26/2011 - Ct Angio Ao+bifem  W/cm &/or Wo/cm  Findings: Motion artifact degrades the study. Extensive plaque in the descending thoracic aorta without significant focal narrowing. No significant change. Plaque in the upper abdominal aorta, at the diaphragmatic hiatus without significant narrowing. Juxtarenal plaque in the aorta is noted. This has slightly progressed. There is plaque in the infrarenal aorta with severe narrowing which has progressed. There is now near occlusion of the infrarenal aorta. Prominence of the right inferior epigastric artery supports pre occlusion in the aorta. This vessel has become more prominent. Moderate narrowing at the origin  of the celiac is stable. SMA is patent. Bilateral single renal arteries are grossly patent with mild atherosclerotic change. Severe narrowing at the origin of the IMA is stable. Right common iliac artery remains occluded. Right external iliac artery and internal iliac artery reconstitute and are severely diminutive with diffuse atherosclerotic change. Moderate long segment narrowing of the distal right external iliac artery. Severe narrowing in the proximal left common iliac artery is stable. Left internal and external iliac arteries are severely diminutive with diffuse atherosclerotic change but no significant focal stenosis. Right common femoral artery and superficial femoral artery are diminutive and patent with atherosclerotic changes. There are more prominent atherosclerotic changes in the distal right superficial femoral artery. Right popliteal artery is patent. Two-vessel runoff to the right ankle via the peroneal and posterior tibial arteries. Left common femoral artery is patent. Profunda femoral arteries patent. Moderate plaque at the origin of the profunda and superficial femoral arteries. The superficial femoral artery is diminutive and patent with mild atherosclerotic changes. There is a significant near occlusive stenosis of the left popliteal artery above the knee joint. This has progressed since the prior study. One vessel runoff to the left ankle via the peroneal artery. Focal atelectasis verses airspace disease at the posterior basal segment of the left lower lobe. Liver, gallbladder, spleen, pancreas are within normal limits. Prominence of the adrenal glands without focal mass is stable. Sub centimeter short axis diameter epigastric and gastrohepatic ligament nodes. No abnormal adenopathy by measurement criteria. Normal appendix. No focal bowel wall thickening or evidence of bowel obstruction. Negative free fluid. Prominent prostate stable. Severe L5 S1 degenerative disc disease. Mild  anterolisthesis L4 upon L5. No pars defect. Severe facet arthropathy at L4-5 and L5- S1. Foraminal stenosis at these levels is present. It is severe on the right at L4-5. Review of the MIP images confirms the above findings. 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 It is nearly occluded. There is irregular plaque throughout the visualized aorta. 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 Diffuse narrowing of the distal right external iliac artery is not significantly changed. Significant stenosis in the proximal left common iliac artery is stable. Right lower extremity arterial structures are diffusely diminutive with two-vessel runoff. Significant stenosis in the left popliteal artery above the knee joint has progressed with one vessel runoff to the left ankle.     Assessment and Plan  78 y.o. w/ PMHx significant for MI by EKG 2009 (no cath), Chronic combined systolic and diastolic CHF (EF 40% by echo 2012), HLD, Tobacco abuse, ETOH abuse, and PVD (severe iliac disease) who is requiring preop clearance for aortobifemoral bypass in the setting of BLE chronic critical limb ischemia.  Plans for lexiscan myoview this morning for preoperative risk stratification. Further plans pending results.  Signed, HOPE, JESSICA PA-C  History and all data above reviewed.  Patient examined.  I agree with the findings as above.  No further chest pain.  The patient exam reveals COR:RRR  ,  Lungs: Clear  ,  Abd: Positive bowel sounds, no rebound no guarding, Ext No edem  .  All available labs, radiology testing, previous records reviewed. Agree with documented assessment and plan. Cath today with further risk stratification based on this.    Vianca Bracher  10:14 AM  09/28/2011

## 2011-09-29 ENCOUNTER — Inpatient Hospital Stay (HOSPITAL_COMMUNITY): Payer: Medicare Other

## 2011-09-29 MED ORDER — THIAMINE HCL 100 MG PO TABS: 100.0000 mg | ORAL_TABLET | Freq: Every day | ORAL | Status: DC

## 2011-09-29 MED ORDER — ALBUTEROL SULFATE (5 MG/ML) 0.5% IN NEBU
2.5000 mg | INHALATION_SOLUTION | Freq: Once | RESPIRATORY_TRACT | Status: AC
  Administered 2011-09-29: 2.5 mg via RESPIRATORY_TRACT

## 2011-09-29 MED ORDER — DOXYCYCLINE HYCLATE 100 MG PO TABS: 100.0000 mg | ORAL_TABLET | Freq: Two times a day (BID) | ORAL | Status: DC

## 2011-09-29 NOTE — Progress Notes (Signed)
ANTIBIOTIC CONSULT NOTE - FOLLOW UP  Pharmacy Consult for Vancomycin, Zosyn Indication: RLE cellulitis  Allergies  Allergen Reactions  . Lyrica (Pregabalin) Swelling    Swelling in feet and ankles.    Patient Measurements: Height: 5\' 7"  (170.2 cm) Weight: 150 lb (68.04 kg) IBW/kg (Calculated) : 66.1  Adjusted Body Weight:   Vital Signs: Temp: 98.2 F (36.8 C) (09/06 0630) Temp src: Oral (09/06 0630) BP: 118/82 mmHg (09/06 0630) Pulse Rate: 89  (09/06 0630) Intake/Output from previous day: 09/05 0701 - 09/06 0700 In: 1360 [P.O.:840; I.V.:120; IV Piggyback:400] Out: 1450 [Urine:1450] Intake/Output from this shift: Total I/O In: 360 [P.O.:360] Out: 250 [Urine:250]  Labs:  [facility] 09/28/11 0711  WBC 10.9*  HGB 12.3*  PLT 327  LABCREA --  CREATININE 1.08   Estimated Creatinine Clearance: 52.7 ml/min (by C-G formula based on Cr of 1.08).  Basename 09/29/11 1110  VANCOTROUGH 12.7  VANCOPEAK --  VANCORANDOM --  GENTTROUGH --  GENTPEAK --  GENTRANDOM --  TOBRATROUGH --  TOBRAPEAK --  TOBRARND --  AMIKACINPEAK --  AMIKACINTROU --  AMIKACIN --     Microbiology: Recent Results (from the past 720 hour(s))  CULTURE, BLOOD (ROUTINE X 2)     Status: Normal (Preliminary result)   Collection Time   09/24/11  6:25 PM      Component Value Range Status Comment   Specimen Description BLOOD LEFT HAND   Final    Special Requests BOTTLES DRAWN AEROBIC AND ANAEROBIC 10CC   Final    Culture  Setup Time 09/25/2011 05:37   Final    Culture     Final    Value:        BLOOD CULTURE RECEIVED NO GROWTH TO DATE CULTURE WILL BE HELD FOR 5 DAYS BEFORE ISSUING A FINAL NEGATIVE REPORT   Report Status PENDING   Incomplete   CULTURE, BLOOD (ROUTINE X 2)     Status: Normal (Preliminary result)   Collection Time   09/24/11  6:40 PM      Component Value Range Status Comment   Specimen Description BLOOD LEFT HAND   Final    Special Requests BOTTLES DRAWN AEROBIC AND ANAEROBIC 10CC   Final     Culture  Setup Time 09/25/2011 05:37   Final    Culture     Final    Value:        BLOOD CULTURE RECEIVED NO GROWTH TO DATE CULTURE WILL BE HELD FOR 5 DAYS BEFORE ISSUING A FINAL NEGATIVE REPORT   Report Status PENDING   Incomplete     Anti-infectives     Start     Dose/Rate Route Frequency Ordered Stop   09/25/11 1200   vancomycin (VANCOCIN) 1,250 mg in sodium chloride 0.9 % 250 mL IVPB        1,250 mg 166.7 mL/hr over 90 Minutes Intravenous Every 24 hours 09/24/11 1824     09/24/11 1900  piperacillin-tazobactam (ZOSYN) IVPB 3.375 g       3.375 g 12.5 mL/hr over 240 Minutes Intravenous 3 times per day 09/24/11 1824     09/24/11 1600   vancomycin (VANCOCIN) IVPB 1000 mg/200 mL premix        1,000 mg 200 mL/hr over 60 Minutes Intravenous  Once 09/24/11 1553 09/24/11 1804   09/24/11 1600   piperacillin-tazobactam (ZOSYN) IVPB 3.375 g  Status:  Discontinued        3.375 g 12.5 mL/hr over 240 Minutes Intravenous  Once 09/24/11 1553 09/24/11 1809  Assessment: 78yo male with RLE cellulitis, awaiting cardiac clearance for peripheral bypass surgery.  Today is day#6 of Vancomycin and Zosyn.  Blood cultures are negative to date.  Vancomycin trough is 12.7 this AM.  Renal function is stable.  Goal of Therapy:  Vancomycin trough level 10-15 mcg/ml  Plan:  1.  Continue Vancomyin 1000mg  IV every 24 hours 2.  Continue Zosyn 3.375gm IV q8, infuse over 4 hours 3.  F/U blood culture results 4.  Monitor renal function  Marisue Humble, PharmD Clinical Pharmacist [facility]

## 2011-09-29 NOTE — Progress Notes (Signed)
Pt discharged home with cousin. Discharge instructions explained pt verbalized understanding

## 2011-09-29 NOTE — Discharge Summary (Signed)
Physician Discharge Summary  Nathaniel Curtis JYN:[id number] DOB: 07-07-1931 DOA: 09/24/2011  PCP: Junious Dresser, MD  Admit date: 09/24/2011 Discharge date: 09/29/2011  Recommendations for Outpatient Follow-up:  1. Follow up with BMP on Monday at pcp office.  2. Follow up with Dr early as outpatient.   Discharge Diagnoses:  Principal Problem:  *PVD (peripheral vascular disease) with claudication Active Problems:  Cellulitis of right foot  HTN (hypertension)  Hyperlipidemia  Tobacco abuse  CHF, chronic  Alcohol abuse  Pre-operative cardiovascular examination   Discharge Condition: stable  Diet recommendation: low sodium diet  Filed Weights   09/24/11 1732 09/24/11 1800  Weight: 70 kg (154 lb 5.2 oz) 68.04 kg (150 lb)    History of present illness:  Patient is a 78 year old white gentleman with a past medical history for tobacco abuse, CHF type unknown, hyperlipidemia and most importantly peripheral vascular disease with complete occlusion of the right common iliac artery and almost complete occlusion of the left common iliac artery. He was seen by Dr. Gretta Began in July 2012 and at that point revascularization surgery was recommended however he refused at that time. Patient comes today at the urging of his granddaughter who is currently visiting him when she noticed that his right leg was red and weepy. Have been asked to admit him for further evaluation    Hospital Course:  #1 peripheral vascular disease: Further recommendations as per vascular surgery. Pain control as needed. He will need aorto bi femoral bypass . 2 D echo showed good LV systolic function, with grade 1 diastolic dysfunction., mild LVH, mild concentric hypertrophy of the Left ventricle and no significant valvular abnormalities.  Myoview scan done which did not show new ischemic defects. Pt is being discharged and follow up with vascular surgery as outpatient.  #2 cellulitis of the right foot: On IV vancomycin and Zosyn  for 3 days. . Blood cultures negative so far. Patient is afebrile and nontoxic. He will be discharged on oral antibiotics to complete the course. #3 tobacco abuse: We have placed a nicotine patch while in the hospital, he has been counseled however is not willing to quit.  #4 history of alcohol abuse: We'll place on thiamine and folate while in the hospital.  #5 CHF, chronic, type unknown: ? Diastolic dysfunction, 2-D echo here shows good LV systolic function, with grade 1 diastolic dysfunction., mild LVH, mild concentric hypertrophy of the Left ventricle and no significant valvular abnormalities. .  #8 hyponatremia:hasn't improved much. Recommend checking bmp  On Monday at PCP office.   Procedures:  myoview   Consultations:  Cardiology  Vascular surgery consult  Discharge Exam: Filed Vitals:   09/29/11 0630  BP: 118/82  Pulse: 89  Temp: 98.2 F (36.8 C)  Resp: 20   Filed Vitals:   09/28/11 1210 09/28/11 1454 09/28/11 2222 09/29/11 0630  BP: 130/55 130/55 113/51 118/82  Pulse: 82 81 96 89  Temp:  98.6 F (37 C) 98.5 F (36.9 C) 98.2 F (36.8 C)  TempSrc:  Oral Oral Oral  Resp:  18 16 20   Height:      Weight:      SpO2:  100% 95% 96%   General: Alert, awake, oriented x3, in no acute distress.  HEENT: No bruits, no goiter.  Heart: Regular rate and rhythm, without murmurs, rubs, gallops.  Lungs: Clear to auscultation bilaterally.  Abdomen: Soft, nontender, nondistended, positive bowel sounds.  Extremities: He has bilateral onychomycosis. On the left he has no edema, on the right he  has redness to the dorsum of his foot and to the first 4 toes, . Neuro: Grossly intact, nonfocal.    Discharge Instructions   Medication List  As of 09/29/2011 11:40 AM   ASK your doctor about these medications         aspirin 81 MG EC tablet   Take 81 mg by mouth daily.      bimatoprost 0.03 % ophthalmic solution   Commonly known as: LUMIGAN   Place 1 drop into the right eye at  bedtime.      clopidogrel 75 MG tablet   Commonly known as: PLAVIX   Take 75 mg by mouth daily.      furosemide 20 MG tablet   Commonly known as: LASIX   Take 20 mg by mouth daily.      gabapentin 600 MG tablet   Commonly known as: NEURONTIN   Take 600 mg by mouth 3 (three) times daily.      lisinopril 2.5 MG tablet   Commonly known as: PRINIVIL,ZESTRIL   Take 2.5 mg by mouth daily.      metoprolol succinate 25 MG 24 hr tablet   Commonly known as: TOPROL-XL   Take 25 mg by mouth daily.      niacin 500 MG CR tablet   Commonly known as: NIASPAN   Take 1,000 mg by mouth at bedtime.      nitroGLYCERIN 0.4 MG SL tablet   Commonly known as: NITROSTAT   Place 0.4 mg under the tongue every 5 (five) minutes as needed. As needed for chest pain.      simvastatin 20 MG tablet   Commonly known as: ZOCOR   Take 20 mg by mouth at bedtime.      spironolactone 25 MG tablet   Commonly known as: ALDACTONE   Take 25 mg by mouth at bedtime.              The results of significant diagnostics from this hospitalization (including imaging, microbiology, ancillary and laboratory) are listed below for reference.    Significant Diagnostic Studies: Ct Angio Ao+bifem W/cm &/or Wo/cm  09/26/2011  *RADIOLOGY REPORT*  Clinical Data:  Iliac stenosis  CT ANGIOGRAPHY OF ABDOMINAL AORTA WITH ILIOFEMORAL RUNOFF  Technique:  Multidetector CT imaging of the abdomen, pelvis and lower extremities was performed using the standard protocol during bolus administration of intravenous contrast.  Multiplanar CT image reconstructions including MIPs were obtained to evaluate the vascular anatomy.  Contrast: OMNIPAQUE IOHEXOL 350 MG/ML SOLN  Comparison:  08/25/2010  Findings:  Motion artifact degrades the study.  Extensive plaque in the descending thoracic aorta without significant focal narrowing.  No significant change.  Plaque in the upper abdominal aorta, at the diaphragmatic hiatus without significant  narrowing.  Juxtarenal plaque in the aorta is noted.  This has slightly progressed.  There is plaque in the infrarenal aorta with severe narrowing which has progressed.  There is now near occlusion of the infrarenal aorta. Prominence of the right inferior epigastric artery supports pre occlusion in the aorta.  This vessel has become more prominent.  Moderate narrowing at the origin of the celiac is stable.  SMA is patent.  Bilateral single renal arteries are grossly patent with mild atherosclerotic change. Severe narrowing at the origin of the IMA is stable.  Right common iliac artery remains occluded.  Right external iliac artery and internal iliac artery reconstitute and are severely diminutive with diffuse atherosclerotic change. Moderate long segment narrowing of the distal  right external iliac artery.  Severe narrowing in the proximal left common iliac artery is stable.  Left internal and external iliac arteries are severely diminutive with diffuse atherosclerotic change but no significant focal stenosis.  Right common femoral artery and superficial femoral artery are diminutive and patent with atherosclerotic changes.  There are more prominent atherosclerotic changes in the distal right superficial femoral artery.  Right popliteal artery is patent.  Two-vessel runoff to the right ankle via the peroneal and posterior tibial arteries.  Left common femoral artery is patent.  Profunda femoral arteries patent.  Moderate plaque at the origin of the profunda and superficial femoral arteries.  The superficial femoral artery is diminutive and patent with mild atherosclerotic changes.  There is a significant near occlusive stenosis of the left popliteal artery above the knee joint.  This has progressed since the prior study. One vessel runoff to the left ankle via the peroneal artery.  Focal atelectasis verses airspace disease at the posterior basal segment of the left lower lobe.  Liver, gallbladder, spleen, pancreas  are within normal limits. Prominence of the adrenal glands without focal mass is stable.  Sub centimeter short axis diameter epigastric and gastrohepatic ligament nodes.  No abnormal adenopathy by measurement criteria.  Normal appendix.  No focal bowel wall thickening or evidence of bowel obstruction.  Negative free fluid.  Prominent prostate stable.  Severe L5 S1 degenerative disc disease.  Mild anterolisthesis L4 upon L5.  No pars defect.  Severe facet arthropathy at L4-5 and L5- S1.  Foraminal stenosis at these levels is present.  It is severe on the right at L4-5.   Review of the MIP images confirms the above findings.  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  It is nearly occluded.  There is irregular plaque throughout the visualized aorta.  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  Diffuse narrowing of the distal right external iliac artery is not significantly changed.  Significant stenosis in the proximal left common iliac artery is stable.  Right lower extremity arterial structures are diffusely diminutive with two-vessel runoff.  Significant stenosis in the left popliteal artery above the knee joint has progressed with one vessel runoff to the left ankle.   Original Report Authenticated By: Donavan Burnet, M.D.    Nm Myocar Multi W/spect W/wall Motion / Ef  09/28/2011  The Lexiscan study was done under the supervision of the [facility] cardiology staff.  There were no EKG changes of ischemia noted.  The quality of the images is fair. There is some motion artefact on the stress images.  Perfusion images show small mid-inferior wall scar without significant peri-infarct ischemia.  The EDV is 66 ml.  The ESV is 27 ml.  The LV EF is 59%. There is mild basilar inferior wall hypokinesis.  Impression: Old inferior wall scar without significant peri-infarct ischemia.  Thomas A. Brackbill MD   Original Report Authenticated By: Heriberto Antigua      Microbiology: Recent Results (from the past 240 hour(s))  CULTURE, BLOOD (ROUTINE X 2)     Status: Normal (Preliminary result)   Collection Time   09/24/11  6:25 PM      Component Value Range Status Comment   Specimen Description BLOOD LEFT HAND   Final    Special Requests BOTTLES DRAWN AEROBIC AND ANAEROBIC 10CC   Final    Culture  Setup Time 09/25/2011 05:37   Final    Culture     Final  Value:        BLOOD CULTURE RECEIVED NO GROWTH TO DATE CULTURE WILL BE HELD FOR 5 DAYS BEFORE ISSUING A FINAL NEGATIVE REPORT   Report Status PENDING   Incomplete   CULTURE, BLOOD (ROUTINE X 2)     Status: Normal (Preliminary result)   Collection Time   09/24/11  6:40 PM      Component Value Range Status Comment   Specimen Description BLOOD LEFT HAND   Final    Special Requests BOTTLES DRAWN AEROBIC AND ANAEROBIC 10CC   Final    Culture  Setup Time 09/25/2011 05:37   Final    Culture     Final    Value:        BLOOD CULTURE RECEIVED NO GROWTH TO DATE CULTURE WILL BE HELD FOR 5 DAYS BEFORE ISSUING A FINAL NEGATIVE REPORT   Report Status PENDING   Incomplete      Labs: Basic Metabolic Panel:  Lab 09/28/11 1610 09/26/11 0610 09/25/11 0459 09/24/11 1823 09/24/11 1500  NA 129* 131* 132* -- 136  K 4.4 4.6 3.9 -- 4.2  CL 95* 94* 98 -- 100  CO2 22 27 25  -- --  GLUCOSE 94 99 88 -- 103*  BUN 9 11 11  -- 6  CREATININE 1.08 1.09 1.07 0.81 1.00  CALCIUM 9.5 9.4 9.2 -- --  MG -- -- -- -- --  PHOS -- -- -- -- --   Liver Function Tests: No results found for this basename: AST:5,ALT:5,ALKPHOS:5,BILITOT:5,PROT:5,ALBUMIN:5 in the last 168 hours No results found for this basename: LIPASE:5,AMYLASE:5 in the last 168 hours No results found for this basename: AMMONIA:5 in the last 168 hours CBC:  Lab 09/28/11 0711 09/26/11 0610 09/25/11 0459 09/24/11 1823 09/24/11 1500 09/24/11 1440  WBC 10.9* 9.5 7.4 9.1 -- 9.5  NEUTROABS -- -- -- -- -- --  HGB 12.3* 12.8* 11.3* 12.1* 11.9* --  HCT 35.6* 38.1* 33.0*  35.6* 35.0* --  MCV 91.8 94.1 92.4 93.2 -- 92.7  PLT 327 336 302 328 -- 366   Cardiac Enzymes: No results found for this basename: CKTOTAL:5,CKMB:5,CKMBINDEX:5,TROPONINI:5 in the last 168 hours BNP: BNP (last 3 results) No results found for this basename: PROBNP:3 in the last 8760 hours CBG: No results found for this basename: GLUCAP:5 in the last 168 hours  Time coordinating discharge: 45 minutes  Signed:  Jaedin Regina  [facility] 09/29/2011, 11:40 AM

## 2011-09-29 NOTE — Progress Notes (Signed)
Clinical Social Work  CSW and patient called patient's cousin (Gene) who is agreeable to transport patient at [facility] and has a key to patient's house. CSW informed MD and RN. Cousin will call RN when he arrives at hospital to pick up patient.  CSW is signing off.  [place], [place] [phone]

## 2011-09-29 NOTE — Progress Notes (Signed)
Clinical Social Work Department BRIEF PSYCHOSOCIAL ASSESSMENT 09/29/2011  Patient:  Nathaniel Curtis     Account Number:  [id number]     Admit date:  09/24/2011  Clinical Social Worker:  Dennison Bulla  Date/Time:  09/29/2011 02:00 PM  Referred by:  Physician  Date Referred:  09/29/2011 Referred for  Psychosocial assessment   Other Referral:   Interview type:  Patient Other interview type:    PSYCHOSOCIAL DATA Living Status:  ALONE Admitted from facility:   Level of care:   Primary support name:  Nathaniel Curtis Primary support relationship to patient:  CHILD, ADULT Degree of support available:   Unknown at this time    CURRENT CONCERNS Current Concerns  Post-Acute Placement  Other - See comment   Other Concerns:   Transportation    SOCIAL WORK ASSESSMENT / PLAN CSW received referral from MD to assist with dc planning and transportation. Per MD, patient reports that family is out of town and he does not have transportation or key to get home.    CSW introduced myself and explained role. Patient reports he lives alone but family assists him. Patient has a cousin (Nathaniel Curtis) who assists with transportation and needs at home. Patient also spoke of a dtr and granddtr that assists. Patient reports that his family is out of town and he does not know if they have a key. Patient gave CSW permission to contact family. Patient thinks that cousin has a key. CSW called Nathaniel Curtis and left a message with friend that answered the phone. CSW called dtr (Nathaniel Curtis) and left a message. No further contacts listed in Epic. CSW will await to hear from family and try to receive other numbers. CSW will continue to follow.   Assessment/plan status:  Psychosocial Support/Ongoing Assessment of Needs Other assessment/ plan:   Information/referral to community resources:   CSW attempted to get in touch with family in order to develop a sense of support for patient.    PATIENT'S/FAMILY'S RESPONSE TO PLAN OF CARE: Patient  was alert and oriented. Patient agreeable for CSW assistance.

## 2011-10-01 LAB — CULTURE, BLOOD (ROUTINE X 2): Culture: NO GROWTH

## 2011-10-04 ENCOUNTER — Encounter: Payer: Self-pay | Admitting: *Deleted

## 2011-10-04 ENCOUNTER — Other Ambulatory Visit: Payer: Self-pay | Admitting: *Deleted

## 2011-10-06 ENCOUNTER — Encounter (HOSPITAL_COMMUNITY): Payer: Self-pay | Admitting: Pharmacy Technician

## 2011-10-10 ENCOUNTER — Inpatient Hospital Stay (HOSPITAL_COMMUNITY): Admission: RE | Admit: 2011-10-10 | Discharge: 2011-10-10 | Payer: Medicare Other | Source: Ambulatory Visit

## 2011-10-10 ENCOUNTER — Inpatient Hospital Stay (HOSPITAL_COMMUNITY)
Admission: EM | Admit: 2011-10-10 | Discharge: 2011-10-19 | DRG: 238 | Disposition: A | Discharge status: Home / Self Care | Payer: Medicare Other | Attending: Vascular Surgery | Admitting: Vascular Surgery

## 2011-10-10 ENCOUNTER — Encounter (HOSPITAL_COMMUNITY): Payer: Self-pay | Admitting: *Deleted

## 2011-10-10 ENCOUNTER — Ambulatory Visit: Payer: Medicare Other | Admitting: Vascular Surgery

## 2011-10-10 ENCOUNTER — Inpatient Hospital Stay (HOSPITAL_COMMUNITY): Payer: Medicare Other

## 2011-10-10 DIAGNOSIS — I252 Old myocardial infarction: Secondary | ICD-10-CM

## 2011-10-10 DIAGNOSIS — Z7982 Long term (current) use of aspirin: Secondary | ICD-10-CM

## 2011-10-10 DIAGNOSIS — I2510 Atherosclerotic heart disease of native coronary artery without angina pectoris: Secondary | ICD-10-CM | POA: Diagnosis present

## 2011-10-10 DIAGNOSIS — L03115 Cellulitis of right lower limb: Secondary | ICD-10-CM

## 2011-10-10 DIAGNOSIS — E785 Hyperlipidemia, unspecified: Secondary | ICD-10-CM | POA: Diagnosis present

## 2011-10-10 DIAGNOSIS — F1010 Alcohol abuse, uncomplicated: Secondary | ICD-10-CM | POA: Diagnosis present

## 2011-10-10 DIAGNOSIS — I70209 Unspecified atherosclerosis of native arteries of extremities, unspecified extremity: Principal | ICD-10-CM | POA: Diagnosis present

## 2011-10-10 DIAGNOSIS — M79609 Pain in unspecified limb: Secondary | ICD-10-CM

## 2011-10-10 DIAGNOSIS — I5032 Chronic diastolic (congestive) heart failure: Secondary | ICD-10-CM | POA: Diagnosis present

## 2011-10-10 DIAGNOSIS — D62 Acute posthemorrhagic anemia: Secondary | ICD-10-CM | POA: Diagnosis not present

## 2011-10-10 DIAGNOSIS — L02419 Cutaneous abscess of limb, unspecified: Secondary | ICD-10-CM | POA: Diagnosis present

## 2011-10-10 DIAGNOSIS — I509 Heart failure, unspecified: Secondary | ICD-10-CM | POA: Diagnosis present

## 2011-10-10 LAB — CBC
Hemoglobin: 12 g/dL — ABNORMAL LOW (ref 13.0–17.0)
MCHC: 35.3 g/dL (ref 30.0–36.0)
RDW: 14.9 % (ref 11.5–15.5)
WBC: 16.4 10*3/uL — ABNORMAL HIGH (ref 4.0–10.5)

## 2011-10-10 LAB — SEDIMENTATION RATE: Sed Rate: 95 mm/hr — ABNORMAL HIGH (ref 0–16)

## 2011-10-10 LAB — CBC WITH DIFFERENTIAL/PLATELET
Basophils Absolute: 0.1 10*3/uL (ref 0.0–0.1)
Eosinophils Absolute: 0.3 10*3/uL (ref 0.0–0.7)
Eosinophils Relative: 2 % (ref 0–5)
MCH: 32 pg (ref 26.0–34.0)
MCHC: 34.9 g/dL (ref 30.0–36.0)
MCV: 91.5 fL (ref 78.0–100.0)
Monocytes Absolute: 1.2 10*3/uL — ABNORMAL HIGH (ref 0.1–1.0)
Platelets: 438 10*3/uL — ABNORMAL HIGH (ref 150–400)
RDW: 14.8 % (ref 11.5–15.5)

## 2011-10-10 LAB — URINALYSIS, ROUTINE W REFLEX MICROSCOPIC
Glucose, UA: NEGATIVE mg/dL
Hgb urine dipstick: NEGATIVE
Specific Gravity, Urine: 1.015 (ref 1.005–1.030)
pH: 6.5 (ref 5.0–8.0)

## 2011-10-10 LAB — BASIC METABOLIC PANEL
Calcium: 10.1 mg/dL (ref 8.4–10.5)
Creatinine, Ser: 0.87 mg/dL (ref 0.50–1.35)
GFR calc non Af Amer: 81 mL/min — ABNORMAL LOW (ref >90)
Sodium: 135 mEq/L (ref 135–145)

## 2011-10-10 LAB — COMPREHENSIVE METABOLIC PANEL
ALT: 9 U/L (ref 0–53)
Albumin: 3.1 g/dL — ABNORMAL LOW (ref 3.5–5.2)
Alkaline Phosphatase: 76 U/L (ref 39–117)
Chloride: 101 mEq/L (ref 96–112)
Potassium: 4.4 mEq/L (ref 3.5–5.1)
Sodium: 138 mEq/L (ref 135–145)
Total Bilirubin: 0.2 mg/dL — ABNORMAL LOW (ref 0.3–1.2)
Total Protein: 7.5 g/dL (ref 6.0–8.3)

## 2011-10-10 LAB — AMYLASE: Amylase: 136 U/L — ABNORMAL HIGH (ref 0–105)

## 2011-10-10 LAB — ABO/RH: ABO/RH(D): B POS

## 2011-10-10 MED ORDER — DEXTROSE 5 % IV SOLN
1.5000 g | INTRAVENOUS | Status: AC
  Administered 2011-10-13: 1.5 g via INTRAVENOUS
  Filled 2011-10-10 (×2): qty 1.5

## 2011-10-10 MED ORDER — SPIRONOLACTONE 25 MG PO TABS
25.0000 mg | ORAL_TABLET | Freq: Every day | ORAL | Status: DC
  Administered 2011-10-10 – 2011-10-12 (×3): 25 mg via ORAL
  Filled 2011-10-10 (×4): qty 1

## 2011-10-10 MED ORDER — LABETALOL HCL 5 MG/ML IV SOLN
10.0000 mg | INTRAVENOUS | Status: DC | PRN
  Filled 2011-10-10: qty 4

## 2011-10-10 MED ORDER — METOPROLOL TARTRATE 1 MG/ML IV SOLN: 2.0000 mg | INTRAVENOUS | Status: DC | PRN

## 2011-10-10 MED ORDER — SIMVASTATIN 20 MG PO TABS
20.0000 mg | ORAL_TABLET | Freq: Every day | ORAL | Status: DC
  Administered 2011-10-10 – 2011-10-12 (×3): 20 mg via ORAL
  Filled 2011-10-10 (×4): qty 1

## 2011-10-10 MED ORDER — NITROGLYCERIN 0.4 MG SL SUBL: 0.4000 mg | SUBLINGUAL_TABLET | SUBLINGUAL | Status: DC | PRN

## 2011-10-10 MED ORDER — SODIUM CHLORIDE 0.9 % IJ SOLN
3.0000 mL | Freq: Two times a day (BID) | INTRAMUSCULAR | Status: DC
  Administered 2011-10-10 – 2011-10-11 (×3): 3 mL via INTRAVENOUS

## 2011-10-10 MED ORDER — FOLIC ACID 1 MG PO TABS
1.0000 mg | ORAL_TABLET | Freq: Every day | ORAL | Status: DC
  Administered 2011-10-11 – 2011-10-12 (×2): 1 mg via ORAL
  Filled 2011-10-10 (×3): qty 1

## 2011-10-10 MED ORDER — NICOTINE 21 MG/24HR TD PT24
21.0000 mg | MEDICATED_PATCH | Freq: Every day | TRANSDERMAL | Status: DC
  Administered 2011-10-18: 21 mg via TRANSDERMAL
  Filled 2011-10-10 (×9): qty 1

## 2011-10-10 MED ORDER — VITAMIN B-1 100 MG PO TABS
100.0000 mg | ORAL_TABLET | Freq: Every day | ORAL | Status: DC
  Administered 2011-10-11 – 2011-10-12 (×2): 100 mg via ORAL
  Filled 2011-10-10 (×3): qty 1

## 2011-10-10 MED ORDER — HYDROMORPHONE HCL PF 1 MG/ML IJ SOLN
1.0000 mg | Freq: Once | INTRAMUSCULAR | Status: AC
  Administered 2011-10-10: 1 mg via INTRAVENOUS
  Filled 2011-10-10: qty 1

## 2011-10-10 MED ORDER — MORPHINE SULFATE 4 MG/ML IJ SOLN
INTRAMUSCULAR | Status: AC
  Administered 2011-10-10: 4 mg
  Filled 2011-10-10: qty 1

## 2011-10-10 MED ORDER — OXYCODONE-ACETAMINOPHEN 5-325 MG PO TABS
1.0000 | ORAL_TABLET | ORAL | Status: DC | PRN
  Administered 2011-10-10 – 2011-10-13 (×9): 2 via ORAL
  Filled 2011-10-10 (×10): qty 2

## 2011-10-10 MED ORDER — ALUM & MAG HYDROXIDE-SIMETH 200-200-20 MG/5ML PO SUSP: 15.0000 mL | ORAL | Status: DC | PRN

## 2011-10-10 MED ORDER — METOPROLOL SUCCINATE ER 25 MG PO TB24
25.0000 mg | ORAL_TABLET | Freq: Every day | ORAL | Status: DC
  Administered 2011-10-13: 25 mg via ORAL
  Filled 2011-10-10 (×3): qty 1

## 2011-10-10 MED ORDER — LISINOPRIL 2.5 MG PO TABS
2.5000 mg | ORAL_TABLET | Freq: Every day | ORAL | Status: DC
  Administered 2011-10-10 – 2011-10-19 (×6): 2.5 mg via ORAL
  Filled 2011-10-10 (×10): qty 1

## 2011-10-10 MED ORDER — BIMATOPROST 0.03 % OP SOLN
1.0000 [drp] | Freq: Every day | OPHTHALMIC | Status: DC
  Administered 2011-10-10 – 2011-10-12 (×3): 1 [drp] via OPHTHALMIC
  Filled 2011-10-10 (×2): qty 2.5

## 2011-10-10 MED ORDER — ASPIRIN EC 81 MG PO TBEC
81.0000 mg | DELAYED_RELEASE_TABLET | Freq: Every day | ORAL | Status: DC
  Administered 2011-10-11 – 2011-10-12 (×2): 81 mg via ORAL
  Filled 2011-10-10 (×3): qty 1

## 2011-10-10 MED ORDER — PIPERACILLIN-TAZOBACTAM 3.375 G IVPB
3.3750 g | Freq: Three times a day (TID) | INTRAVENOUS | Status: DC
  Administered 2011-10-10 – 2011-10-13 (×7): 3.375 g via INTRAVENOUS
  Filled 2011-10-10 (×11): qty 50

## 2011-10-10 MED ORDER — SODIUM CHLORIDE 0.9 % IV SOLN
INTRAVENOUS | Status: DC
  Administered 2011-10-10: 23:00:00 via INTRAVENOUS

## 2011-10-10 MED ORDER — HYDRALAZINE HCL 20 MG/ML IJ SOLN
10.0000 mg | INTRAMUSCULAR | Status: DC | PRN
  Filled 2011-10-10: qty 0.5

## 2011-10-10 MED ORDER — GABAPENTIN 600 MG PO TABS
600.0000 mg | ORAL_TABLET | Freq: Three times a day (TID) | ORAL | Status: DC
  Administered 2011-10-10 – 2011-10-12 (×7): 600 mg via ORAL
  Filled 2011-10-10 (×10): qty 1

## 2011-10-10 MED ORDER — GUAIFENESIN-DM 100-10 MG/5ML PO SYRP: 15.0000 mL | ORAL_SOLUTION | ORAL | Status: DC | PRN

## 2011-10-10 MED ORDER — MORPHINE SULFATE 2 MG/ML IJ SOLN
2.0000 mg | INTRAMUSCULAR | Status: DC | PRN
  Administered 2011-10-10 – 2011-10-11 (×7): 4 mg via INTRAVENOUS
  Administered 2011-10-11: 2 mg via INTRAVENOUS
  Administered 2011-10-12: 4 mg via INTRAVENOUS
  Administered 2011-10-12: 2 mg via INTRAVENOUS
  Administered 2011-10-12 – 2011-10-13 (×2): 4 mg via INTRAVENOUS
  Administered 2011-10-13: 2 mg via INTRAVENOUS
  Filled 2011-10-10: qty 1
  Filled 2011-10-10: qty 2
  Filled 2011-10-10: qty 1
  Filled 2011-10-10 (×2): qty 2
  Filled 2011-10-10: qty 1
  Filled 2011-10-10 (×3): qty 2
  Filled 2011-10-10: qty 4
  Filled 2011-10-10 (×3): qty 2

## 2011-10-10 MED ORDER — SODIUM CHLORIDE 0.9 % IV BOLUS (SEPSIS)
500.0000 mL | Freq: Once | INTRAVENOUS | Status: AC
  Administered 2011-10-10: 500 mL via INTRAVENOUS

## 2011-10-10 MED ORDER — ENOXAPARIN SODIUM 40 MG/0.4ML ~~LOC~~ SOLN
40.0000 mg | SUBCUTANEOUS | Status: DC
  Administered 2011-10-11 – 2011-10-13 (×3): 40 mg via SUBCUTANEOUS
  Filled 2011-10-10 (×4): qty 0.4

## 2011-10-10 MED ORDER — ONDANSETRON HCL 4 MG/2ML IJ SOLN
4.0000 mg | Freq: Four times a day (QID) | INTRAMUSCULAR | Status: DC | PRN
  Administered 2011-10-11: 4 mg via INTRAVENOUS
  Filled 2011-10-10 (×2): qty 2

## 2011-10-10 MED ORDER — VANCOMYCIN HCL 1000 MG IV SOLR
1250.0000 mg | INTRAVENOUS | Status: DC
  Administered 2011-10-10 – 2011-10-17 (×7): 1250 mg via INTRAVENOUS
  Filled 2011-10-10 (×9): qty 1250

## 2011-10-10 MED ORDER — ACETAMINOPHEN 650 MG RE SUPP: 325.0000 mg | RECTAL | Status: DC | PRN

## 2011-10-10 MED ORDER — SODIUM CHLORIDE 0.9 % IJ SOLN: 3.0000 mL | INTRAMUSCULAR | Status: DC | PRN

## 2011-10-10 MED ORDER — PHENOL 1.4 % MT LIQD
1.0000 | OROMUCOSAL | Status: DC | PRN
  Filled 2011-10-10: qty 177

## 2011-10-10 MED ORDER — SODIUM CHLORIDE 0.9 % IV SOLN: 250.0000 mL | INTRAVENOUS | Status: DC | PRN

## 2011-10-10 MED ORDER — ACETAMINOPHEN 325 MG PO TABS: 325.0000 mg | ORAL_TABLET | ORAL | Status: DC | PRN

## 2011-10-10 NOTE — ED Notes (Signed)
I saw and evaluated the patient, reviewed the resident's note and I agree with the findings and plan.  Pt with known PVD, scheduled for aorto-fem bypass this week with worsening cellulitis of R foot. Admitted by Vascular.   Charles B. Bernette Mayers, MD 10/10/11 2333

## 2011-10-10 NOTE — H&P (Signed)
[facility] H&P NOTE 10/10/2011 DOB: February 11, 2031 MRN : [id number]  CC: Pain and cellulitis in right foot in pt with know aorto-iliac occlusive disease  History of Present Illness: Nathaniel Curtis is a 78 y.o. male With HX of CAD, smoker and ETOH abuse with Known PVD - aorto-iliac occlusive disease. Pt was scheduled for Aorto bifemoral bypass on Fri 10/13/11. Pt has had cardiac clearance. Pt had recently been D/C from [facility] on 09/29/11. He states on Saturday he began to have constant pain and increasing redness in the right foot. He did not think he could get a doctor on the weekend and called our office today to see Dr. Arbie Cookey- however EMT brought him to ER. He states the left foot is the same.   He states he drinks 3 drinks of hard liquor daily and smokes 1 PPD. He states he goes "cold [place]" off both ETOH and cigarettes.  Past Medical History  Diagnosis Date  . Hyperlipidemia   . CHF (congestive heart failure)     Echo 09/26/11 - EF 50-55%, grade 1 diastolic dysfunction, no RWMAs, no significant valvular abnls  . Heart attack 2009    found on EKG, no cath  . Arthritis   . Glaucoma   . Tobacco abuse     1ppd for 70+yrs  . PVD (peripheral vascular disease)     severe iliac disease  . ETOH abuse     70+ yrs, 3 glasses of brandy/night     Past Surgical History  Procedure Date  . Cataract extraction w/ intraocular lens implant RIGHT EYE 2009  . Cardiac catheterization 10/2007  . Eye surgery 02/09     ROS: [x]  Positive  [ ]  Denies    General: [ ]  Weight loss, [ ]  Fever, [ ]  chills Neurologic: [ ]  Dizziness, [ ]  Blackouts, [ ]  Seizure [ ]  Stroke, [ ]  "Mini stroke", [ ]  Slurred speech, [ ]  Temporary blindness; [ ]  weakness in arms or legs, [ ]  Hoarseness Cardiac: [ ]  Chest pain/pressure, [ ]  Shortness of breath at rest [ ]  Shortness of breath with exertion, [ ]  Atrial fibrillation or irregular heartbeat Vascular: [x ] Pain in legs with walking, [x ] Pain in legs  at rest, [ ]  Pain in legs at night,  [x ] Non-healing ulcer, [ ]  Blood clot in vein/DVT,   Pulmonary: [ ]  Home oxygen, [ ]  Productive cough, [ ]  Coughing up blood, [ ]  Asthma,  [ ]  Wheezing Musculoskeletal:  [ ]  Arthritis, [ ]  Low back pain, [ ]  Joint pain Hematologic: [ ]  Easy Bruising, [ ]  Anemia; [ ]  Hepatitis Gastrointestinal: [ ]  Blood in stool, [ ]  Gastroesophageal Reflux/heartburn, [ ]  Trouble swallowing Urinary: [ ]  chronic Kidney disease, [ ]  on HD - [ ]  MWF or [ ]  TTHS, [ ]  Burning with urination, [ ]  Difficulty urinating Skin: [ ]  Rashes, [ ]  Wounds Psychological: [ ]  Anxiety, [ ]  Depression  Social History History  Substance Use Topics  . Smoking status: Current Every Day Smoker -- 1.0 packs/day for 70 years    Types: Cigarettes  . Smokeless tobacco: Not on file  . Alcohol Use: Yes     70+ years, 3 glasses of brandy a night    Family History Family History  Problem Relation Age of Onset  . Heart disease Maternal Grandfather     MI 54yo    Allergies  Allergen Reactions  . Lyrica (Pregabalin) Swelling    Swelling  in feet and ankles.    Current Facility-Administered Medications  Medication Dose Route Frequency Provider Last Rate Last Dose  . aspirin EC tablet 81 mg  81 mg Oral Daily Regina J Roczniak, [place]      . bimatoprost (LUMIGAN) 0.03 % ophthalmic solution 1 drop  1 drop Right Eye QHS Regina J [place], [place]      . gabapentin (NEURONTIN) tablet 600 mg  600 mg Oral TID Marlowe Shores, [place]      . HYDROmorphone (DILAUDID) injection 1 mg  1 mg Intravenous Once Purvis Sheffield, MD   1 mg at 10/10/11 1544  . HYDROmorphone (DILAUDID) injection 1 mg  1 mg Intravenous Once Purvis Sheffield, MD      . lisinopril (PRINIVIL,ZESTRIL) tablet 2.5 mg  2.5 mg Oral Daily Amelia Jo [place], [place]      . metoprolol succinate (TOPROL-XL) 24 hr tablet 25 mg  25 mg Oral Daily Marlowe Sh[place], [place]      . nitroGLYCERIN (NITROSTAT) SL tablet 0.4 mg  0.4 mg Sublingual Q5 min PRN Marlowe Shores, PA      . simvastatin (ZOCOR) tablet 20 mg  20 mg Oral QHS Regina J Roczniak, [place]      . sodium chloride 0.9 % bolus 500 mL  500 mL Intravenous Once Purvis Sheffield, MD   500 mL at 10/10/11 1545  . spironolactone (ALDACTONE) tablet 25 mg  25 mg Oral QHS Marlowe Sh[place], [place]       Current Outpatient Prescriptions  Medication Sig Dispense Refill  . aspirin 81 MG EC tablet Take 81 mg by mouth daily.        . bimatoprost (LUMIGAN) 0.03 % ophthalmic solution Place 1 drop into the right eye at bedtime.        . clopidogrel (PLAVIX) 75 MG tablet Take 75 mg by mouth daily.      Marland Kitchen gabapentin (NEURONTIN) 600 MG tablet Take 600 mg by mouth 3 (three) times daily.      Marland Kitchen lisinopril (PRINIVIL,ZESTRIL) 2.5 MG tablet Take 2.5 mg by mouth daily.        . metoprolol succinate (TOPROL-XL) 25 MG 24 hr tablet Take 25 mg by mouth daily.      . niacin (NIASPAN) 500 MG CR tablet Take 1,000 mg by mouth at bedtime.      . nitroGLYCERIN (NITROSTAT) 0.4 MG SL tablet Place 0.4 mg under the tongue every 5 (five) minutes as needed. As needed for chest pain.      . simvastatin (ZOCOR) 20 MG tablet Take 20 mg by mouth at bedtime.        Marland Kitchen spironolactone (ALDACTONE) 25 MG tablet Take 25 mg by mouth at bedtime.       Marland Kitchen doxycycline (VIBRA-TABS) 100 MG tablet Take 100 mg by mouth 2 (two) times daily. Finished medication 10/09/11         Imaging: No results found.  Significant Diagnostic Studies: CBC Lab Results  Component Value Date   WBC 17.0* 10/10/2011   HGB 12.4* 10/10/2011   HCT 35.5* 10/10/2011   MCV 91.5 10/10/2011   PLT 438* 10/10/2011    BMET    Component Value Date/Time   NA 135 10/10/2011 1544   K 5.0 10/10/2011 1544   CL 100 10/10/2011 1544   CO2 24 10/10/2011 1544   GLUCOSE 95 10/10/2011 1544   BUN 14 10/10/2011 1544   CREATININE 0.87 10/10/2011 1544   CALCIUM 10.1 10/10/2011 1544   GFRNONAA 81*  10/10/2011 1544   GFRAA >90 10/10/2011 1544    COAG No results found for this basename: INR, PROTIME     No results found for this basename: PTT     Physical Examination BP Readings from Last 3 Encounters:  10/10/11 150/127  09/29/11 130/59  09/29/11 130/59   Temp Readings from Last 3 Encounters:  10/10/11 98.1 F (36.7 C) Oral  09/29/11 98.4 F (36.9 C) Oral  09/29/11 98.4 F (36.9 C) Oral   SpO2 Readings from Last 3 Encounters:  10/10/11 98%  09/29/11 95%  09/29/11 95%   Pulse Readings from Last 3 Encounters:  10/10/11 128  09/29/11 76  09/29/11 76    General:  WDWN in NAD Gait: Normal HENT: WNL Eyes: Pupils equal Pulmonary: normal non-labored breathing , without Rales, rhonchi,  wheezing Cardiac: RRR, without  Murmurs, rubs or gallops; Abdomen: soft, NT, no masses Skin: no rashes, ulcers noted Vascular Exam/Pulses: Min doppler flow right femoral Biphasic flow left femoral + monophasic PT doppler signal Right No distal pulses dopplerable on left  Left foot warm and pink  Right foot cellulitic to ankle , no ulcers noted  Extremities with ischemic changes, no Gangrene , positive cellulitis on right to ankle; no open wounds;  Musculoskeletal: no muscle wasting or atrophy  Neurologic: A&O X 3; Appropriate Affect ;  SENSATION: normal; MOTOR FUNCTION: Pt has good and equal strength in all extremities - 5/5 Speech is fluent/normal  Invasive Vascular Imaging: 09/25/11 CT ANGIOGRAPHY OF ABDOMINAL AORTA WITH ILIOFEMORAL RUNOFF  IMPRESSION:  Significant stenosis in the infrarenal aorta has progressed since  the prior study. It is nearly occluded. There is irregular plaque  throughout the visualized aorta.  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 Diffuse  narrowing of the distal right external iliac artery is not  significantly changed.  Significant stenosis in the proximal left common iliac artery is  stable.  Right lower extremity arterial structures are diffusely diminutive  with two-vessel runoff.  Significant stenosis in the left popliteal  artery above the knee  joint has progressed with one vessel runoff to the left ankle.  ASSESSMENT/PLAN: Increasing cellulitis and pain in pt with chronic aorto-iliac occlusive disease. Admit for pain control and IV antibiotics If pt cellulitis resolves will proceed with Aorto-bifem bypass ETOH abuse watch for DT's, folic acid and thiamine ordered Tobacco abuse - nicotine patch Chronic CHF - cont meds

## 2011-10-10 NOTE — ED Notes (Signed)
Per EMS- pt has had increased redness, pain, swelling, and drainage since yesterday in rt foot. Foot is tender and EMS reports thready pulse. Pt scheduled to have "vascular surgery" on Friday. Pt was discharged a week ago for same. 10/10 pain. 18g in RAC.

## 2011-10-10 NOTE — Progress Notes (Signed)
ANTIBIOTIC CONSULT NOTE - INITIAL  Pharmacy Consult for Vancomycin Indication: Cellulitis (right foot)  Allergies  Allergen Reactions  . Lyrica (Pregabalin) Swelling    Swelling in feet and ankles.    Patient Measurements:    Vital Signs: Temp: 98.1 F (36.7 C) (09/17 1437) Temp src: Oral (09/17 1437) BP: 150/127 mmHg (09/17 1437) Pulse Rate: 128  (09/17 1437) Intake/Output from previous day:   Intake/Output from this shift:    Labs:  Basename 10/10/11 1544  WBC 17.0*  HGB 12.4*  PLT 438*  LABCREA --  CREATININE 0.87   The CrCl is unknown because both a height and weight (above a minimum accepted value) are required for this calculation. No results found for this basename: VANCOTROUGH:2,VANCOPEAK:2,VANCORANDOM:2,GENTTROUGH:2,GENTPEAK:2,GENTRANDOM:2,TOBRATROUGH:2,TOBRAPEAK:2,TOBRARND:2,AMIKACINPEAK:2,AMIKACINTROU:2,AMIKACIN:2, in the last 72 hours   Microbiology: Recent Results (from the past 720 hour(s))  CULTURE, BLOOD (ROUTINE X 2)     Status: Normal   Collection Time   09/24/11  6:25 PM      Component Value Range Status Comment   Specimen Description BLOOD LEFT HAND   Final    Special Requests BOTTLES DRAWN AEROBIC AND ANAEROBIC 10CC   Final    Culture  Setup Time 09/25/2011 05:37   Final    Culture NO GROWTH 5 DAYS   Final    Report Status 10/01/2011 FINAL   Final   CULTURE, BLOOD (ROUTINE X 2)     Status: Normal   Collection Time   09/24/11  6:40 PM      Component Value Range Status Comment   Specimen Description BLOOD LEFT HAND   Final    Special Requests BOTTLES DRAWN AEROBIC AND ANAEROBIC 10CC   Final    Culture  Setup Time 09/25/2011 05:37   Final    Culture NO GROWTH 5 DAYS   Final    Report Status 10/01/2011 FINAL   Final     Medical History: Past Medical History  Diagnosis Date  . Hyperlipidemia   . CHF (congestive heart failure)     Echo 09/26/11 - EF 50-55%, grade 1 diastolic dysfunction, no RWMAs, no significant valvular abnls  . Heart attack  2009    found on EKG, no cath  . Arthritis   . Glaucoma   . Tobacco abuse     1ppd for 70+yrs  . PVD (peripheral vascular disease)     severe iliac disease  . ETOH abuse     70+ yrs, 3 glasses of brandy/night     Assessment: 78 yo male who presents with increased redness, pain, swelling, and drainage since yesterday to start vancomycin for cellulitis. Zosyn extended interval infusion has also been ordered by the admitting team. Of note, pt was on vancomycin during a previous admission earlier this month; Vancomycin 1250 mg IV q24h with trough of 12.7 (SCr 0.81-1.08 during previous admission). SCr today is 0.87. WBC is elevated at 17.0, pt is afebrile, no cultures have been ordered.  Goal of Therapy:  Vancomycin trough level 10-15 mcg/ml  Plan:  1. Vancomycin 1250 mg IV q24h 2. Follow up renal function, clinical progress, and vancomycin trough at steady state  Crist Fat L 10/10/2011,5:14 PM

## 2011-10-10 NOTE — Pre-Procedure Instructions (Signed)
20 Jun [address]  10/10/2011   Your procedure is scheduled on:  Friday September 20  Report to [facility] at 6:15 AM.  Call this number if you have problems the morning of surgery: [phone]   Remember:   Do not eat or drink:After Midnight.    Take these medicines the morning of surgery with A SIP OF WATER: gabapentin (Neurontin), metoprolol (Toprol). Bring Nitro to surgery in case needed.    Do not wear jewelry, make-up or nail polish.  Do not wear lotions, powders, or perfumes. You may wear deodorant.  Do not shave 48 hours prior to surgery. Men may shave face and neck.  Do not bring valuables to the hospital.  Contacts, dentures or bridgework may not be worn into surgery.  Leave suitcase in the car. After surgery it may be brought to your room.  For patients admitted to the hospital, checkout time is 11:00 AM the day of discharge.   Patients discharged the day of surgery will not be allowed to drive home.  Name and phone number of your driver: NA  Special Instructions: CHG Shower Use Special Wash: 1/2 bottle night before surgery and 1/2 bottle morning of surgery.   Please read over the following fact sheets that you were given: Pain Booklet, Coughing and Deep Breathing, Blood Transfusion Information and Surgical Site Infection Prevention

## 2011-10-10 NOTE — ED Provider Notes (Signed)
History     CSN: [id number]  Arrival date & time 10/10/11  1437   First MD Initiated Contact with Patient 10/10/11 1527      Chief Complaint  Patient presents with  . Foot Pain    (Consider location/radiation/quality/duration/timing/severity/associated sxs/prior treatment) Patient is a 78 y.o. male presenting with lower extremity pain. The history is provided by the patient.  Foot Pain This is a recurrent problem. Episode onset: 3 days ago. The problem occurs constantly. The problem has been gradually worsening. Pertinent negatives include no abdominal pain, chest pain, coughing, fever, headaches, nausea, neck pain, numbness or vomiting. Associated symptoms comments: redness. Nothing aggravates the symptoms. Treatments tried: otc pain medicine. The treatment provided no relief.    Past Medical History  Diagnosis Date  . Hyperlipidemia   . CHF (congestive heart failure)     Echo 09/26/11 - EF 50-55%, grade 1 diastolic dysfunction, no RWMAs, no significant valvular abnls  . Heart attack 2009    found on EKG, no cath  . Arthritis   . Glaucoma   . Tobacco abuse     1ppd for 70+yrs  . PVD (peripheral vascular disease)     severe iliac disease  . ETOH abuse     70+ yrs, 3 glasses of brandy/night     Past Surgical History  Procedure Date  . Cataract extraction w/ intraocular lens implant RIGHT EYE 2009  . Cardiac catheterization 10/2007  . Eye surgery 02/09    Family History  Problem Relation Age of Onset  . Heart disease Maternal Grandfather     MI 78yo    History  Substance Use Topics  . Smoking status: Current Every Day Smoker -- 1.0 packs/day for 70 years    Types: Cigarettes  . Smokeless tobacco: Not on file  . Alcohol Use: Yes     70+ years, 3 glasses of brandy a night      Review of Systems  Constitutional: Negative for fever.  HENT: Negative for rhinorrhea, drooling and neck pain.   Eyes: Negative for pain.  Respiratory: Negative for cough and shortness  of breath.   Cardiovascular: Negative for chest pain and leg swelling.  Gastrointestinal: Negative for nausea, vomiting, abdominal pain and diarrhea.  Genitourinary: Negative for dysuria and hematuria.  Musculoskeletal: Negative for gait problem.  Skin: Negative for color change.  Neurological: Negative for numbness and headaches.  Hematological: Negative for adenopathy.  Psychiatric/Behavioral: Negative for behavioral problems.  All other systems reviewed and are negative.    Allergies  Lyrica  Home Medications   Current Outpatient Rx  Name Route Sig Dispense Refill  . ASPIRIN 81 MG PO TBEC Oral Take 81 mg by mouth daily.      Marland Kitchen BIMATOPROST 0.03 % OP SOLN Right Eye Place 1 drop into the right eye at bedtime.      . CLOPIDOGREL BISULFATE 75 MG PO TABS Oral Take 75 mg by mouth daily.    Marland Kitchen GABAPENTIN 600 MG PO TABS Oral Take 600 mg by mouth 3 (three) times daily.    Marland Kitchen LISINOPRIL 2.5 MG PO TABS Oral Take 2.5 mg by mouth daily.      Marland Kitchen METOPROLOL SUCCINATE ER 25 MG PO TB24 Oral Take 25 mg by mouth daily.    Marland Kitchen NIACIN ER (ANTIHYPERLIPIDEMIC) 500 MG PO TBCR Oral Take 1,000 mg by mouth at bedtime.    Marland Kitchen NITROGLYCERIN 0.4 MG SL SUBL Sublingual Place 0.4 mg under the tongue every 5 (five) minutes as needed. As  needed for chest pain.    Marland Kitchen SIMVASTATIN 20 MG PO TABS Oral Take 20 mg by mouth at bedtime.      . SPIRONOLACTONE 25 MG PO TABS Oral Take 25 mg by mouth at bedtime.     Marland Kitchen DOXYCYCLINE HYCLATE 100 MG PO TABS Oral Take 100 mg by mouth 2 (two) times daily. Finished medication 10/09/11      BP 150/127  Pulse 128  Temp 98.1 F (36.7 C) (Oral)  Resp 22  SpO2 98%  Physical Exam  Nursing note and vitals reviewed. Constitutional: He is oriented to person, place, and time. He appears well-developed and well-nourished.  HENT:  Head: Normocephalic and atraumatic.  Right Ear: External ear normal.  Left Ear: External ear normal.  Nose: Nose normal.  Mouth/Throat: Oropharynx is clear and  moist. No oropharyngeal exudate.  Eyes: Conjunctivae normal and EOM are normal. Pupils are equal, round, and reactive to light.  Neck: Normal range of motion. Neck supple.  Cardiovascular: Regular rhythm, normal heart sounds and intact distal pulses.  Exam reveals no gallop and no friction rub.   No murmur heard.      tachycardia  Pulmonary/Chest: Effort normal and breath sounds normal. No respiratory distress. He has no wheezes.  Abdominal: Soft. Bowel sounds are normal. He exhibits no distension. There is no tenderness. There is no rebound and no guarding.  Musculoskeletal: Normal range of motion. He exhibits tenderness. He exhibits no edema.       Erythema and weeping localized to dorsal surface of right foot extending from base of toes to base of ankle. Desquamation extending around to plantar surface. Normal rom of ankles bilaterally, mildly delayed cap refill in distal extremities. Normal movement of toes bilaterally. Normal sensation in distal extremities.    Neurological: He is alert and oriented to person, place, and time.  Skin: Skin is warm and dry.  Psychiatric: He has a normal mood and affect. His behavior is normal.    ED Course  Procedures (including critical care time)  Labs Reviewed - No data to display No results found.   No diagnosis found.    MDM  3:40 PM 78 y.o. male w hx of tobacco/etoh abuse, CHF, peripheral vascular disease with complete occlusion of the right common iliac artery and almost complete occlusion of the left common iliac artery scheduled for aorta bifemoral bypass here 3 days from now pw right foot pain and redness that began 3 days ago. Pt recently d/c for right foot cellulitis. He denies fever, N/V/D at home. Pt tachycardic here, appears well on exam. Will get labs, pain control.   5:34 PM Will be admitted to vascular service.   Clinical Impression 1. Cellulitis of right foot         Purvis Sheffield, MD 10/11/11 [phone]

## 2011-10-11 ENCOUNTER — Encounter (HOSPITAL_COMMUNITY): Admission: RE | Admit: 2011-10-11 | Payer: Medicare Other | Source: Ambulatory Visit

## 2011-10-11 LAB — C-REACTIVE PROTEIN: CRP: 4 mg/dL — ABNORMAL HIGH (ref <0.60)

## 2011-10-11 LAB — SURGICAL PCR SCREEN
MRSA, PCR: NEGATIVE
Staphylococcus aureus: NEGATIVE

## 2011-10-11 MED ORDER — SODIUM CHLORIDE 0.9 % IJ SOLN
10.0000 mL | INTRAMUSCULAR | Status: DC | PRN
  Administered 2011-10-16: 10 mL

## 2011-10-11 NOTE — Progress Notes (Addendum)
[facility]   History of Present Illness: Nathaniel Curtis is a 78 y.o. male  With HX of CAD, smoker and ETOH abuse with Known PVD - aorto-iliac occlusive disease. Pt was scheduled for Aorto bifemoral bypass on Fri 10/13/11. Pt has had cardiac clearance. Pt had recently been D/C from [facility] on 09/29/11. He states on Saturday he began to have constant pain and increasing redness in the right foot. He did not think he could get a doctor on the weekend and called our office today to see Dr. Arbie Cookey- however EMT brought him to ER. He states the left foot is the same.       Objective 99/54 69 97.8 F (36.6 C) (Oral) 18 96%  Intake/Output Summary (Last 24 hours) at 10/11/11 0756 Last data filed at 10/11/11 0700  Gross per 24 hour  Intake    460 ml  Output    400 ml  Net     60 ml   PE:  Left foot and anterior shin/ankle cellulitis with hypersensitivity to touch. Doppler anterior tibial pulses mono phasic bilateral. Right foot warm erythema without ulcers. Left foot cool to touch.   Assessment/Planning: Increasing cellulitis and pain in pt with chronic aorto-iliac occlusive disease.  Admit for pain control and IV antibiotics  If pt cellulitis resolves will proceed with Aorto-bifem bypass    Clinton Gallant [facility] 10/11/2011 7:56 AM --  Laboratory Lab Results:  Basename 10/10/11 1929 10/10/11 1544  WBC 16.4* 17.0*  HGB 12.0* 12.4*  HCT 34.0* 35.5*  PLT 455* 438*   BMET  Basename 10/10/11 1929 10/10/11 1544  NA 138 135  K 4.4 5.0  CL 101 100  CO2 25 24  GLUCOSE 79 95  BUN 13 14  CREATININE 0.82 0.87  CALCIUM 9.8 10.1    COAG Lab Results  Component Value Date   INR 1.10 10/10/2011   No results found for this basename: PTT    Antibiotics Anti-infectives     Start     Dose/Rate Route Frequency Ordered Stop   10/11/11 0600   cefUROXime (ZINACEF) 1.5 g in dextrose 5 % 50 mL IVPB        1.5 g 100 mL/hr over 30 Minutes Intravenous 30  min pre-op 10/10/11 1840     10/10/11 2200  piperacillin-tazobactam (ZOSYN) IVPB 3.375 g       3.375 g 12.5 mL/hr over 240 Minutes Intravenous 3 times per day 10/10/11 1812     10/10/11 1800   vancomycin (VANCOCIN) 1,250 mg in sodium chloride 0.9 % 250 mL IVPB        1,250 mg 166.7 mL/hr over 90 Minutes Intravenous Every 24 hours 10/10/11 1727               I have examined the patient, reviewed and agree with above.  Rommie Dunn, MD 10/11/2011 1:24 PM

## 2011-10-11 NOTE — Care Management Note (Unsigned)
    Page 1 of 2   10/18/2011     2:29:32 PM   CARE MANAGEMENT NOTE 10/18/2011  Patient:  [facility]   Account Number:  [id number]  Date Initiated:  10/11/2011  Documentation initiated by:  SIMMONS,Malloree Raboin  Subjective/Objective Assessment:   ADMITTED WITH RIGHT FOOT PAIN; LIVES AT HOME ALONE- HAS KIDS BUT NOT CLOSE BY; NEEDS ASSISTANCE WITH ADLS.     Action/Plan:   DISCHARGE PLANNING INITIATED.   Anticipated DC Date:  10/16/2011   Anticipated DC Plan:  HOME W HOME HEALTH SERVICES      DC Planning Services  CM consult      Choice offered to / List presented to:  C-1 Patient        HH arranged  HH-1 RN  HH-2 PT  HH-3 OT      [facility] agency  [facility]   Status of service:  In process, will continue to follow Medicare Important Message given?   (If response is "NO", the following Medicare IM given date fields will be blank) Date Medicare IM given:   Date Additional Medicare IM given:    Discharge Disposition:    Per UR Regulation:  Reviewed for med. necessity/level of care/duration of stay  If discussed at Long Length of Stay Meetings, dates discussed:   10/17/2011    Comments:  10/18/11  1411  Shanigua Gibb SIMMONS RN, BSN [phone] REFERRAL PLACED TO HH [facility] OF [facility] PER CHOICE FOR HHRN/PT/OT; INFORMATION FAXED- CONFIRMATION RECEIVED;  HOWEVER- HH [facility] COULD NOT SEE PT UNTIL LATER NEXT WEEK; REFERRAL THEN PALCED TO [facility] PER CHOICE FOR HH; SOC DATE: WITHIN 24-48HRS POST D/C; NCM WILL FOLLOW.  10/18/11  1007  Miranda Frese SIMMONS RN, BSN [phone] DISCUSSED HH WITH PT- WOULD LIKE TO USE [facility];  MD/PA-  PLEASE ORDER AT D/C;  NCM WILL FOLLOW.  10/11/11  1434  Darrien Belter SIMMONS RN, BSN [phone] PLEASE ORDER PT/ OT EVALS WHEN APPROPRIATE;  NCM WILL FOLLOW.

## 2011-10-12 MED ORDER — MAGNESIUM CITRATE PO SOLN
1.0000 | Freq: Once | ORAL | Status: AC
  Administered 2011-10-12: 1 via ORAL
  Filled 2011-10-12 (×2): qty 296

## 2011-10-12 MED ORDER — CEFAZOLIN SODIUM 1-5 GM-% IV SOLN
1.0000 g | INTRAVENOUS | Status: AC
  Administered 2011-10-13: 1 g via INTRAVENOUS
  Filled 2011-10-12: qty 50

## 2011-10-12 NOTE — Progress Notes (Signed)
[facility]  CC: Pain and cellulitis in right foot in pt with know aorto-iliac occlusive disease  History of Present Illness: Nathaniel Curtis is a 78 y.o. male  With HX of CAD, smoker and ETOH abuse with Known PVD - aorto-iliac occlusive disease. Pt was scheduled for Aorto bifemoral bypass on Fri 10/13/11. Pt has had cardiac clearance. Pt had recently been D/C from [facility] on 09/29/11. He states on Saturday he began to have constant pain and increasing redness in the right foot. He did not think he could get a doctor on the weekend and called our office today to see Dr. Arbie Cookey- however EMT brought him to ER. He states the left foot is the same.        Objective 133/70 62 98.4 F (36.9 C) (Oral) 19 97% No intake or output data in the 24 hours ending 10/12/11 0745 PE: Left foot and anterior shin/ankle cellulitis with hypersensitivity to touch.  Doppler anterior tibial pulses mono phasic bilateral.  Right foot warm erythema without ulcers.  Left foot cool to touch.    Assessment/Planning: Dx: chronic aorto-iliac occlusive disease Plan: Aorto-bifem bypass by Dr. Karie Schwalbe. Early Dose magnesium citrate for bowel prep pre-op    Clinton Gallant [facility] 10/12/2011 7:45 AM --  Laboratory Lab Results:  Basename 10/10/11 1929 10/10/11 1544  WBC 16.4* 17.0*  HGB 12.0* 12.4*  HCT 34.0* 35.5*  PLT 455* 438*   BMET  Basename 10/10/11 1929 10/10/11 1544  NA 138 135  K 4.4 5.0  CL 101 100  CO2 25 24  GLUCOSE 79 95  BUN 13 14  CREATININE 0.82 0.87  CALCIUM 9.8 10.1    COAG Lab Results  Component Value Date   INR 1.10 10/10/2011   No results found for this basename: PTT    Antibiotics Anti-infectives     Start     Dose/Rate Route Frequency Ordered Stop   10/13/11 0000   ceFAZolin (ANCEF) IVPB 1 g/50 mL premix     Comments: Send with pt to OR      1 g 100 mL/hr over 30 Minutes Intravenous On call 10/12/11 0744 10/14/11 0000   10/11/11 0600    cefUROXime (ZINACEF) 1.5 g in dextrose 5 % 50 mL IVPB        1.5 g 100 mL/hr over 30 Minutes Intravenous 30 min pre-op 10/10/11 1840     10/10/11 2200  piperacillin-tazobactam (ZOSYN) IVPB 3.375 g       3.375 g 12.5 mL/hr over 240 Minutes Intravenous 3 times per day 10/10/11 1812     10/10/11 1800   vancomycin (VANCOCIN) 1,250 mg in sodium chloride 0.9 % 250 mL IVPB        1,250 mg 166.7 mL/hr over 90 Minutes Intravenous Every 24 hours 10/10/11 1727

## 2011-10-12 NOTE — Progress Notes (Signed)
Pt. Refused to be on bed alarm.

## 2011-10-12 NOTE — Progress Notes (Signed)
Pt had a 17 beat run of SVT. Pt does not c/o of being SOB, no chest pain noted. Pt resting comfortably in bed. PA aware. No new orders received.  Will continue to monitor. Dion Saucier

## 2011-10-12 NOTE — Progress Notes (Signed)
Subjective: Interval History: none.. less pain in his lower extremities  Objective: Vital signs in last 24 hours: Temp:  [97.4 F (36.3 C)-98.6 F (37 C)] 98.4 F (36.9 C) (09/19 0601) Pulse Rate:  [62-85] 62  (09/19 0601) Resp:  [16-19] 19  (09/19 0601) BP: (98-133)/(49-70) 133/70 mmHg (09/19 0601) SpO2:  [92 %-97 %] 97 % (09/19 0601) Weight:  [141 lb 12.1 oz (64.3 kg)] 141 lb 12.1 oz (64.3 kg) (09/19 0601)  Intake/Output from previous day:   Intake/Output this shift:    Comfortable. His feet have mild erythema bilaterally. Consistent with the severe chronic ischemia. Remains afebrile.  Lab Results:  Basename 10/10/11 1929 10/10/11 1544  WBC 16.4* 17.0*  HGB 12.0* 12.4*  HCT 34.0* 35.5*  PLT 455* 438*   BMET  Basename 10/10/11 1929 10/10/11 1544  NA 138 135  K 4.4 5.0  CL 101 100  CO2 25 24  GLUCOSE 79 95  BUN 13 14  CREATININE 0.82 0.87  CALCIUM 9.8 10.1    Studies/Results: Dg Chest 2 View  10/10/2011  *RADIOLOGY REPORT*  Clinical Data: 78-year-old male preoperative study.  Cough shortness of breath. Smoker.  CHEST - 2 VIEW  Comparison: None.  Findings: Lung volumes within normal limits.  Cardiac size and mediastinal contours are within normal limits.  Visualized tracheal air column is within normal limits.  Diffuse increased interstitial markings, slightly more confluent about the left hilum and right infrahilar region.  Mild apical scarring.  No pneumothorax, edema, effusion or consolidation.  Mild lower thoracic compression deformity appears chronic.  IMPRESSION: Probable chronic lung changes, no definite acute cardiopulmonary abnormality.   Original Report Authenticated By: H.LEE HALL III, M.D.    Dg Ankle 2 Views Right  10/10/2011  *RADIOLOGY REPORT*  Clinical Data: 78-year-old male with pain.  Cellulitis of the foot. Redness extending up the right ankle.  RIGHT ANKLE - 2 VIEW  Comparison: Right foot series 06/22/2010.  Findings: Calcaneus intact.  Bone  mineralization about the right ankle is within normal limits.  No evidence of ankle joint effusion.  Talar dome intact.  No distal tibia or fibula fracture.  IMPRESSION: No acute osseous abnormality identified about the right ankle.   Original Report Authenticated By: H.LEE HALL III, M.D.    Ct Angio Ao+bifem W/cm &/or Wo/cm  09/26/2011  *RADIOLOGY REPORT*  Clinical Data:  Iliac stenosis  CT ANGIOGRAPHY OF ABDOMINAL AORTA WITH ILIOFEMORAL RUNOFF  Technique:  Multidetector CT imaging of the abdomen, pelvis and lower extremities was performed using the standard protocol during bolus administration of intravenous contrast.  Multiplanar CT image reconstructions including MIPs were obtained to evaluate the vascular anatomy.  Contrast: 100mL OMNIPAQUE IOHEXOL 350 MG/ML SOLN  Comparison:  08/25/2010  Findings:  Motion artifact degrades the study.  Extensive plaque in the descending thoracic aorta without significant focal narrowing.  No significant change.  Plaque in the upper abdominal aorta, at the diaphragmatic hiatus without significant narrowing.  Juxtarenal plaque in the aorta is noted.  This has slightly progressed.  There is plaque in the infrarenal aorta with severe narrowing which has progressed.  There is now near occlusion of the infrarenal aorta. Prominence of the right inferior epigastric artery supports pre occlusion in the aorta.  This vessel has become more prominent.  Moderate narrowing at the origin of the celiac is stable.  SMA is patent.  Bilateral single renal arteries are grossly patent with mild atherosclerotic change. Severe narrowing at the origin of the IMA is stable.    Right common iliac artery remains occluded.  Right external iliac artery and internal iliac artery reconstitute and are severely diminutive with diffuse atherosclerotic change. Moderate long segment narrowing of the distal right external iliac artery.  Severe narrowing in the proximal left common iliac artery is stable.  Left  internal and external iliac arteries are severely diminutive with diffuse atherosclerotic change but no significant focal stenosis.  Right common femoral artery and superficial femoral artery are diminutive and patent with atherosclerotic changes.  There are more prominent atherosclerotic changes in the distal right superficial femoral artery.  Right popliteal artery is patent.  Two-vessel runoff to the right ankle via the peroneal and posterior tibial arteries.  Left common femoral artery is patent.  Profunda femoral arteries patent.  Moderate plaque at the origin of the profunda and superficial femoral arteries.  The superficial femoral artery is diminutive and patent with mild atherosclerotic changes.  There is a significant near occlusive stenosis of the left popliteal artery above the knee joint.  This has progressed since the prior study. One vessel runoff to the left ankle via the peroneal artery.  Focal atelectasis verses airspace disease at the posterior basal segment of the left lower lobe.  Liver, gallbladder, spleen, pancreas are within normal limits. Prominence of the adrenal glands without focal mass is stable.  Sub centimeter short axis diameter epigastric and gastrohepatic ligament nodes.  No abnormal adenopathy by measurement criteria.  Normal appendix.  No focal bowel wall thickening or evidence of bowel obstruction.  Negative free fluid.  Prominent prostate stable.  Severe L5 S1 degenerative disc disease.  Mild anterolisthesis L4 upon L5.  No pars defect.  Severe facet arthropathy at L4-5 and L5- S1.  Foraminal stenosis at these levels is present.  It is severe on the right at L4-5.   Review of the MIP images confirms the above findings.  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  It is nearly occluded.  There is irregular plaque throughout the visualized aorta.  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  Diffuse narrowing of the distal  right external iliac artery is not significantly changed.  Significant stenosis in the proximal left common iliac artery is stable.  Right lower extremity arterial structures are diffusely diminutive with two-vessel runoff.  Significant stenosis in the left popliteal artery above the knee joint has progressed with one vessel runoff to the left ankle.   Original Report Authenticated By: ARTHUR A. HOSS, M.D.    Nm Myocar Multi W/spect W/wall Motion / Ef  09/28/2011  The Lexiscan study was done under the supervision of the [facility] cardiology staff.  There were no EKG changes of ischemia noted.  The quality of the images is fair. There is some motion artefact on the stress images.  Perfusion images show small mid-inferior wall scar without significant peri-infarct ischemia.  The EDV is 66 ml.  The ESV is 27 ml.  The LV EF is 59%. There is mild basilar inferior wall hypokinesis.  Impression: Old inferior wall scar without significant peri-infarct ischemia.  Thomas A. Brackbill MD   Original Report Authenticated By: [email or web address]    Dg Foot 2 Views Right  10/10/2011  *RADIOLOGY REPORT*  Clinical Data: 78-year-old male with cellulitis pain and redness.  RIGHT FOOT - 2 VIEW  Comparison: Right foot series 06/22/2010.  Findings: Stable varus angulation at the fifth MTP joint.  Bone mineralization throughout the right foot appears stable since 2012. No osteolysis.  No   fracture or dislocation identified.  Joint spaces preserved.  No subcutaneous gas. No radiopaque foreign body identified.  IMPRESSION: Stable radiographic appearance of the right foot since 2012.   Original Report Authenticated By: H.LEE HALL III, M.D.    Anti-infectives: Anti-infectives     Start     Dose/Rate Route Frequency Ordered Stop   10/13/11 0000   ceFAZolin (ANCEF) IVPB 1 g/50 mL premix     Comments: Send with pt to OR      1 g 100 mL/hr over 30 Minutes Intravenous On call 10/12/11 0744 10/14/11 0000   10/11/11 0600   cefUROXime (ZINACEF) 1.5  g in dextrose 5 % 50 mL IVPB        1.5 g 100 mL/hr over 30 Minutes Intravenous 30 min pre-op 10/10/11 1840     10/10/11 2200   piperacillin-tazobactam (ZOSYN) IVPB 3.375 g        3.375 g 12.5 mL/hr over 240 Minutes Intravenous 3 times per day 10/10/11 1812     10/10/11 1800   vancomycin (VANCOCIN) 1,250 mg in sodium chloride 0.9 % 250 mL IVPB        1,250 mg 166.7 mL/hr over 90 Minutes Intravenous Every 24 hours 10/10/11 1727            Assessment/Plan: s/p * No surgery found * 4 aortobifemoral bypass in the morning. I again explained the procedure and expected recovery with 1-2 days in the intensive care unit in proximally 5 days postoperatively.   LOS: 2 days   Truett Mcfarlan 10/12/2011, 7:45 AM    

## 2011-10-13 ENCOUNTER — Inpatient Hospital Stay (HOSPITAL_COMMUNITY): Payer: Medicare Other | Admitting: Anesthesiology

## 2011-10-13 ENCOUNTER — Encounter (HOSPITAL_COMMUNITY): Payer: Self-pay | Admitting: Anesthesiology

## 2011-10-13 ENCOUNTER — Other Ambulatory Visit: Payer: Self-pay

## 2011-10-13 ENCOUNTER — Encounter (HOSPITAL_COMMUNITY)
Admission: EM | Disposition: A | Discharge status: Home / Self Care | Payer: Self-pay | Source: Home / Self Care | Attending: Vascular Surgery

## 2011-10-13 ENCOUNTER — Inpatient Hospital Stay (HOSPITAL_COMMUNITY): Payer: Medicare Other

## 2011-10-13 ENCOUNTER — Inpatient Hospital Stay (HOSPITAL_COMMUNITY): Admission: RE | Admit: 2011-10-13 | Payer: Medicare Other | Source: Ambulatory Visit | Admitting: Vascular Surgery

## 2011-10-13 DIAGNOSIS — I739 Peripheral vascular disease, unspecified: Secondary | ICD-10-CM

## 2011-10-13 HISTORY — PX: AORTA - BILATERAL FEMORAL ARTERY BYPASS GRAFT: SHX1175

## 2011-10-13 LAB — BASIC METABOLIC PANEL
CO2: 23 mEq/L (ref 19–32)
Calcium: 10 mg/dL (ref 8.4–10.5)
Calcium: 9.1 mg/dL (ref 8.4–10.5)
GFR calc Af Amer: 89 mL/min — ABNORMAL LOW (ref >90)
GFR calc non Af Amer: 76 mL/min — ABNORMAL LOW (ref >90)
GFR calc non Af Amer: 69 mL/min — ABNORMAL LOW (ref >90)
Potassium: 5.4 mEq/L — ABNORMAL HIGH (ref 3.5–5.1)
Potassium: 4.8 mEq/L (ref 3.5–5.1)
Sodium: 130 mEq/L — ABNORMAL LOW (ref 135–145)
Sodium: 135 mEq/L (ref 135–145)

## 2011-10-13 LAB — POCT I-STAT 7, (LYTES, BLD GAS, ICA,H+H)
Acid-base deficit: 1 mmol/L (ref 0.0–2.0)
Bicarbonate: 23.7 mEq/L (ref 20.0–24.0)
Calcium, Ion: 1.07 mmol/L — ABNORMAL LOW (ref 1.13–1.30)
HCT: 25 % — ABNORMAL LOW (ref 39.0–52.0)
Hemoglobin: 8.5 g/dL — ABNORMAL LOW (ref 13.0–17.0)
Sodium: 133 mEq/L — ABNORMAL LOW (ref 135–145)
pH, Arterial: 7.387 (ref 7.350–7.450)
pO2, Arterial: 441 mmHg — ABNORMAL HIGH (ref 80.0–100.0)

## 2011-10-13 LAB — BLOOD GAS, ARTERIAL
Bicarbonate: 19.1 mEq/L — ABNORMAL LOW (ref 20.0–24.0)
O2 Saturation: 98.7 %
Patient temperature: 100.3
TCO2: 20.3 mmol/L (ref 0–100)

## 2011-10-13 LAB — PROTIME-INR
INR: 1.41 (ref 0.00–1.49)
Prothrombin Time: 16.9 seconds — ABNORMAL HIGH (ref 11.6–15.2)

## 2011-10-13 LAB — POCT I-STAT 3, ART BLOOD GAS (G3+)
Acid-base deficit: 6 mmol/L — ABNORMAL HIGH (ref 0.0–2.0)
Bicarbonate: 20.2 mEq/L (ref 20.0–24.0)
Patient temperature: 39.5
TCO2: 21 mmol/L (ref 0–100)
pH, Arterial: 7.279 — ABNORMAL LOW (ref 7.350–7.450)
pO2, Arterial: 94 mmHg (ref 80.0–100.0)

## 2011-10-13 LAB — CBC
Hemoglobin: 9.2 g/dL — ABNORMAL LOW (ref 13.0–17.0)
MCH: 31.5 pg (ref 26.0–34.0)
MCHC: 33.7 g/dL (ref 30.0–36.0)
Platelets: 263 10*3/uL (ref 150–400)
RBC: 2.92 MIL/uL — ABNORMAL LOW (ref 4.22–5.81)

## 2011-10-13 LAB — GLUCOSE, CAPILLARY: Glucose-Capillary: 80 mg/dL (ref 70–99)

## 2011-10-13 LAB — APTT: aPTT: 37 seconds (ref 24–37)

## 2011-10-13 SURGERY — CREATION, BYPASS, ARTERIAL, AORTA TO FEMORAL, BILATERAL, USING GRAFT
Anesthesia: General | Site: Abdomen | Wound class: Clean

## 2011-10-13 MED ORDER — DIPHENHYDRAMINE HCL 50 MG/ML IJ SOLN: 12.5000 mg | Freq: Four times a day (QID) | INTRAMUSCULAR | Status: DC | PRN

## 2011-10-13 MED ORDER — CALCIUM CHLORIDE 10 % IV SOLN
INTRAVENOUS | Status: DC | PRN
  Administered 2011-10-13: .25 g via INTRAVENOUS

## 2011-10-13 MED ORDER — LORAZEPAM 2 MG/ML IJ SOLN
1.0000 mg | Freq: Four times a day (QID) | INTRAMUSCULAR | Status: AC | PRN
  Filled 2011-10-13: qty 1

## 2011-10-13 MED ORDER — PHENYLEPHRINE HCL 10 MG/ML IJ SOLN
10.0000 mg | INTRAVENOUS | Status: DC | PRN
  Administered 2011-10-13: 20 ug/min via INTRAVENOUS

## 2011-10-13 MED ORDER — MORPHINE SULFATE (PF) 1 MG/ML IV SOLN
INTRAVENOUS | Status: AC
  Filled 2011-10-13: qty 25

## 2011-10-13 MED ORDER — PANTOPRAZOLE SODIUM 40 MG IV SOLR
40.0000 mg | Freq: Every day | INTRAVENOUS | Status: DC
  Administered 2011-10-13 – 2011-10-17 (×5): 40 mg via INTRAVENOUS
  Filled 2011-10-13 (×6): qty 40

## 2011-10-13 MED ORDER — NALOXONE HCL 0.4 MG/ML IJ SOLN: 0.4000 mg | INTRAMUSCULAR | Status: DC | PRN

## 2011-10-13 MED ORDER — BISACODYL 10 MG RE SUPP: 10.0000 mg | Freq: Every day | RECTAL | Status: DC | PRN

## 2011-10-13 MED ORDER — SODIUM BICARBONATE 8.4 % IV SOLN
100.0000 meq | Freq: Once | INTRAVENOUS | Status: AC
  Administered 2011-10-13: 100 meq via INTRAVENOUS
  Filled 2011-10-13: qty 100

## 2011-10-13 MED ORDER — SODIUM CHLORIDE 0.9 % IJ SOLN
9.0000 mL | INTRAMUSCULAR | Status: DC | PRN
  Administered 2011-10-13: 9 mL via INTRAVENOUS

## 2011-10-13 MED ORDER — SUFENTANIL CITRATE 50 MCG/ML IV SOLN
INTRAVENOUS | Status: DC | PRN
  Administered 2011-10-13: 15 ug via INTRAVENOUS
  Administered 2011-10-13 (×2): 5 ug via INTRAVENOUS

## 2011-10-13 MED ORDER — MAGNESIUM SULFATE 40 MG/ML IJ SOLN
2.0000 g | Freq: Every day | INTRAMUSCULAR | Status: DC | PRN
  Filled 2011-10-13: qty 50

## 2011-10-13 MED ORDER — PROTAMINE SULFATE 10 MG/ML IV SOLN
INTRAVENOUS | Status: DC | PRN
  Administered 2011-10-13: 50 mg via INTRAVENOUS

## 2011-10-13 MED ORDER — HEPARIN SODIUM (PORCINE) 1000 UNIT/ML IJ SOLN
INTRAMUSCULAR | Status: AC
  Filled 2011-10-13: qty 1

## 2011-10-13 MED ORDER — HYDROMORPHONE HCL PF 1 MG/ML IJ SOLN
INTRAMUSCULAR | Status: AC
  Filled 2011-10-13: qty 1

## 2011-10-13 MED ORDER — DOPAMINE-DEXTROSE 3.2-5 MG/ML-% IV SOLN: 3.0000 ug/kg/min | INTRAVENOUS | Status: DC

## 2011-10-13 MED ORDER — SODIUM CHLORIDE 0.9 % IV SOLN
500.0000 mL | Freq: Once | INTRAVENOUS | Status: AC | PRN
  Administered 2011-10-13: 500 mL via INTRAVENOUS

## 2011-10-13 MED ORDER — LORAZEPAM 1 MG PO TABS: 1.0000 mg | ORAL_TABLET | Freq: Four times a day (QID) | ORAL | Status: DC | PRN

## 2011-10-13 MED ORDER — METOPROLOL TARTRATE 1 MG/ML IV SOLN: 2.0000 mg | INTRAVENOUS | Status: DC | PRN

## 2011-10-13 MED ORDER — HEPARIN SODIUM (PORCINE) 1000 UNIT/ML IJ SOLN
INTRAMUSCULAR | Status: DC | PRN
  Administered 2011-10-13: 7000 [IU] via INTRAVENOUS

## 2011-10-13 MED ORDER — SODIUM CHLORIDE 0.9 % IV SOLN
INTRAVENOUS | Status: DC | PRN
  Administered 2011-10-13 (×2): via INTRAVENOUS

## 2011-10-13 MED ORDER — SODIUM CHLORIDE 0.9 % IV SOLN
INTRAVENOUS | Status: DC
  Administered 2011-10-13: 125 mL/h via INTRAVENOUS
  Administered 2011-10-16 – 2011-10-17 (×2): via INTRAVENOUS

## 2011-10-13 MED ORDER — BIMATOPROST 0.01 % OP SOLN
1.0000 [drp] | Freq: Every day | OPHTHALMIC | Status: DC
  Administered 2011-10-13 – 2011-10-18 (×6): 1 [drp] via OPHTHALMIC

## 2011-10-13 MED ORDER — SODIUM BICARBONATE 8.4 % IV SOLN
INTRAVENOUS | Status: AC
  Filled 2011-10-13: qty 100

## 2011-10-13 MED ORDER — DOCUSATE SODIUM 100 MG PO CAPS
100.0000 mg | ORAL_CAPSULE | Freq: Every day | ORAL | Status: DC
  Administered 2011-10-15 – 2011-10-19 (×4): 100 mg via ORAL
  Filled 2011-10-13 (×5): qty 1

## 2011-10-13 MED ORDER — HYDRALAZINE HCL 20 MG/ML IJ SOLN
10.0000 mg | INTRAMUSCULAR | Status: DC | PRN
  Filled 2011-10-13: qty 0.5

## 2011-10-13 MED ORDER — METOPROLOL TARTRATE 1 MG/ML IV SOLN
5.0000 mg | Freq: Four times a day (QID) | INTRAVENOUS | Status: DC
Start: 2011-10-13 — End: 2011-10-19
  Administered 2011-10-14 – 2011-10-19 (×19): 5 mg via INTRAVENOUS
  Filled 2011-10-13 (×24): qty 5

## 2011-10-13 MED ORDER — ACETAMINOPHEN 325 MG PO TABS: 325.0000 mg | ORAL_TABLET | ORAL | Status: DC | PRN

## 2011-10-13 MED ORDER — SODIUM CHLORIDE 0.9 % IV BOLUS (SEPSIS)
250.0000 mL | Freq: Once | INTRAVENOUS | Status: AC
  Administered 2011-10-13: 250 mL via INTRAVENOUS

## 2011-10-13 MED ORDER — SODIUM CHLORIDE 0.9 % IR SOLN
Status: DC | PRN
  Administered 2011-10-13: 09:00:00

## 2011-10-13 MED ORDER — ETOMIDATE 2 MG/ML IV SOLN
INTRAVENOUS | Status: DC | PRN
  Administered 2011-10-13: 14 mg via INTRAVENOUS

## 2011-10-13 MED ORDER — DEXTROSE 5 % IV SOLN
1.5000 g | Freq: Two times a day (BID) | INTRAVENOUS | Status: AC
  Administered 2011-10-13 – 2011-10-14 (×2): 1.5 g via INTRAVENOUS
  Filled 2011-10-13 (×2): qty 1.5

## 2011-10-13 MED ORDER — LABETALOL HCL 5 MG/ML IV SOLN
10.0000 mg | INTRAVENOUS | Status: DC | PRN
  Filled 2011-10-13: qty 4

## 2011-10-13 MED ORDER — LACTATED RINGERS IV SOLN: INTRAVENOUS | Status: DC | PRN

## 2011-10-13 MED ORDER — LORAZEPAM 2 MG/ML IJ SOLN: 0.0000 mg | Freq: Two times a day (BID) | INTRAMUSCULAR | Status: AC

## 2011-10-13 MED ORDER — ALBUMIN HUMAN 5 % IV SOLN
INTRAVENOUS | Status: DC | PRN
  Administered 2011-10-13 (×5): via INTRAVENOUS

## 2011-10-13 MED ORDER — ACETAMINOPHEN 650 MG RE SUPP
325.0000 mg | RECTAL | Status: DC | PRN
  Administered 2011-10-13: 650 mg via RECTAL
  Filled 2011-10-13 (×2): qty 1

## 2011-10-13 MED ORDER — ONDANSETRON HCL 4 MG/2ML IJ SOLN
4.0000 mg | Freq: Four times a day (QID) | INTRAMUSCULAR | Status: DC | PRN
  Administered 2011-10-14: 4 mg via INTRAVENOUS
  Filled 2011-10-13: qty 2

## 2011-10-13 MED ORDER — 0.9 % SODIUM CHLORIDE (POUR BTL) OPTIME
TOPICAL | Status: DC | PRN
  Administered 2011-10-13: 3000 mL

## 2011-10-13 MED ORDER — DIPHENHYDRAMINE HCL 12.5 MG/5ML PO ELIX
12.5000 mg | ORAL_SOLUTION | Freq: Four times a day (QID) | ORAL | Status: DC | PRN
  Filled 2011-10-13: qty 5

## 2011-10-13 MED ORDER — ROCURONIUM BROMIDE 100 MG/10ML IV SOLN
INTRAVENOUS | Status: DC | PRN
  Administered 2011-10-13: 10 mg via INTRAVENOUS
  Administered 2011-10-13: 60 mg via INTRAVENOUS
  Administered 2011-10-13 (×2): 10 mg via INTRAVENOUS

## 2011-10-13 MED ORDER — LORAZEPAM 2 MG/ML IJ SOLN
0.0000 mg | Freq: Four times a day (QID) | INTRAMUSCULAR | Status: AC
  Administered 2011-10-14 (×2): 2 mg via INTRAVENOUS
  Filled 2011-10-13: qty 1

## 2011-10-13 MED ORDER — MORPHINE SULFATE (PF) 1 MG/ML IV SOLN
INTRAVENOUS | Status: DC
  Administered 2011-10-13: 3 mg via INTRAVENOUS
  Administered 2011-10-13 – 2011-10-14 (×3): via INTRAVENOUS
  Administered 2011-10-14: 7.5 mg via INTRAVENOUS
  Administered 2011-10-14: 4.09 mg via INTRAVENOUS
  Administered 2011-10-14: 30 mg via INTRAVENOUS
  Administered 2011-10-14: 09:00:00 via INTRAVENOUS
  Administered 2011-10-14: 1.7 mg via INTRAVENOUS
  Administered 2011-10-14: 31.43 mg via INTRAVENOUS
  Administered 2011-10-15: 16.5 mg via INTRAVENOUS
  Administered 2011-10-15: 1.5 mg via INTRAVENOUS
  Administered 2011-10-15: 20:00:00 via INTRAVENOUS
  Administered 2011-10-15: 8.92 mg via INTRAVENOUS
  Administered 2011-10-15: 1.5 mg via INTRAVENOUS
  Administered 2011-10-15: 10.5 mg via INTRAVENOUS
  Administered 2011-10-15: 1 mg via INTRAVENOUS
  Administered 2011-10-16: 17:00:00 via INTRAVENOUS
  Administered 2011-10-16: 10.44 mg via INTRAVENOUS
  Administered 2011-10-16: 10.5 mg via INTRAVENOUS
  Administered 2011-10-16: 18 mg via INTRAVENOUS
  Administered 2011-10-16: 9 mg via INTRAVENOUS
  Administered 2011-10-16: 4.01 mg via INTRAVENOUS
  Administered 2011-10-16: 1.5 mg via INTRAVENOUS
  Administered 2011-10-17: 25 mg via INTRAVENOUS
  Administered 2011-10-17: 10.5 mg via INTRAVENOUS
  Administered 2011-10-17: 1.5 mg via INTRAVENOUS
  Administered 2011-10-17: 15 mg via INTRAVENOUS
  Filled 2011-10-13 (×9): qty 25

## 2011-10-13 MED ORDER — MIDAZOLAM HCL 5 MG/5ML IJ SOLN
INTRAMUSCULAR | Status: DC | PRN
  Administered 2011-10-13: .5 mg via INTRAVENOUS

## 2011-10-13 MED ORDER — HYDROMORPHONE HCL PF 1 MG/ML IJ SOLN
0.2500 mg | INTRAMUSCULAR | Status: DC | PRN
  Administered 2011-10-13 (×3): 0.5 mg via INTRAVENOUS

## 2011-10-13 MED ORDER — PHENOL 1.4 % MT LIQD: 1.0000 | OROMUCOSAL | Status: DC | PRN

## 2011-10-13 MED ORDER — ONDANSETRON HCL 4 MG/2ML IJ SOLN
INTRAMUSCULAR | Status: DC | PRN
  Administered 2011-10-13: 4 mg via INTRAVENOUS

## 2011-10-13 MED ORDER — SODIUM CHLORIDE 0.9 % IV SOLN
INTRAVENOUS | Status: DC | PRN
  Administered 2011-10-13 (×2): via INTRAVENOUS

## 2011-10-13 MED ORDER — POTASSIUM CHLORIDE CRYS ER 20 MEQ PO TBCR: 20.0000 meq | EXTENDED_RELEASE_TABLET | Freq: Every day | ORAL | Status: DC | PRN

## 2011-10-13 MED ORDER — LIDOCAINE HCL (CARDIAC) 20 MG/ML IV SOLN
INTRAVENOUS | Status: DC | PRN
  Administered 2011-10-13: 60 mg via INTRAVENOUS

## 2011-10-13 SURGICAL SUPPLY — 62 items
BENZOIN TINCTURE PRP APPL 2/3 (GAUZE/BANDAGES/DRESSINGS) ×4 IMPLANT
CANISTER SUCTION 2500CC (MISCELLANEOUS) ×2 IMPLANT
CANNULA VESSEL W/WING WO/VALVE (CANNULA) ×2 IMPLANT
CLIP LIGATING EXTRA MED SLVR (CLIP) ×2 IMPLANT
CLIP LIGATING EXTRA SM BLUE (MISCELLANEOUS) ×2 IMPLANT
CLOTH BEACON ORANGE TIMEOUT ST (SAFETY) ×2 IMPLANT
CLSR STERI-STRIP ANTIMIC 1/2X4 (GAUZE/BANDAGES/DRESSINGS) ×4 IMPLANT
COVER SURGICAL LIGHT HANDLE (MISCELLANEOUS) ×2 IMPLANT
COVER TABLE BACK 60X90 (DRAPES) ×2 IMPLANT
DRAPE BILATERAL SPLIT (DRAPES) IMPLANT
DRAPE CV SPLIT W-CLR ANES SCRN (DRAPES) IMPLANT
DRAPE WARM FLUID 44X44 (DRAPE) ×2 IMPLANT
DRSG COVADERM 4X10 (GAUZE/BANDAGES/DRESSINGS) IMPLANT
DRSG COVADERM 4X6 (GAUZE/BANDAGES/DRESSINGS) IMPLANT
ELECT BLADE 4.0 EZ CLEAN MEGAD (MISCELLANEOUS) ×2
ELECT REM PT RETURN 9FT ADLT (ELECTROSURGICAL) ×4
ELECTRODE BLDE 4.0 EZ CLN MEGD (MISCELLANEOUS) ×1 IMPLANT
ELECTRODE REM PT RTRN 9FT ADLT (ELECTROSURGICAL) ×2 IMPLANT
GLOVE BIO SURGEON STRL SZ 6.5 (GLOVE) ×4 IMPLANT
GLOVE BIOGEL PI IND STRL 6.5 (GLOVE) ×3 IMPLANT
GLOVE BIOGEL PI IND STRL 7.0 (GLOVE) ×1 IMPLANT
GLOVE BIOGEL PI INDICATOR 6.5 (GLOVE) ×3
GLOVE BIOGEL PI INDICATOR 7.0 (GLOVE) ×1
GLOVE ECLIPSE 7.0 STRL STRAW (GLOVE) ×2 IMPLANT
GLOVE SS BIOGEL STRL SZ 7.5 (GLOVE) ×1 IMPLANT
GLOVE SUPERSENSE BIOGEL SZ 7.5 (GLOVE) ×1
GLOVE SURG SS PI 6.0 STRL IVOR (GLOVE) ×4 IMPLANT
GOWN STRL NON-REIN LRG LVL3 (GOWN DISPOSABLE) ×12 IMPLANT
GRAFT HEMASHIELD 18X9MM (Vascular Products) ×2 IMPLANT
INSERT FOGARTY 61MM (MISCELLANEOUS) IMPLANT
INSERT FOGARTY SM (MISCELLANEOUS) IMPLANT
KIT BASIN OR (CUSTOM PROCEDURE TRAY) ×2 IMPLANT
KIT ROOM TURNOVER OR (KITS) ×2 IMPLANT
LOOP VESSEL MAXI BLUE (MISCELLANEOUS) ×2 IMPLANT
NS IRRIG 1000ML POUR BTL (IV SOLUTION) ×6 IMPLANT
PACK AORTA (CUSTOM PROCEDURE TRAY) ×2 IMPLANT
PAD ARMBOARD 7.5X6 YLW CONV (MISCELLANEOUS) ×6 IMPLANT
SPECIMEN JAR MEDIUM (MISCELLANEOUS) ×2 IMPLANT
SPONGE LAP 18X18 X RAY DECT (DISPOSABLE) IMPLANT
STAPLER VISISTAT 35W (STAPLE) ×2 IMPLANT
SUT ETHIBOND 5 LR DA (SUTURE) IMPLANT
SUT PDS AB 1 TP1 54 (SUTURE) ×4 IMPLANT
SUT PROLENE 3 0 SH1 36 (SUTURE) ×4 IMPLANT
SUT PROLENE 5 0 C 1 24 (SUTURE) ×4 IMPLANT
SUT PROLENE 5 0 C 1 36 (SUTURE) IMPLANT
SUT PROLENE 6 0 CC (SUTURE) ×4 IMPLANT
SUT SILK 2 0 SH CR/8 (SUTURE) ×2 IMPLANT
SUT SILK 2 0 TIES 17X18 (SUTURE) ×1
SUT SILK 2-0 18XBRD TIE BLK (SUTURE) ×1 IMPLANT
SUT SILK 3 0 (SUTURE) ×1
SUT SILK 3 0 TIES 17X18 (SUTURE) ×1
SUT SILK 3-0 18XBRD TIE 12 (SUTURE) ×1 IMPLANT
SUT SILK 3-0 18XBRD TIE BLK (SUTURE) ×1 IMPLANT
SUT VIC AB 2-0 CT1 36 (SUTURE) ×6 IMPLANT
SUT VIC AB 3-0 SH 27 (SUTURE) ×2
SUT VIC AB 3-0 SH 27X BRD (SUTURE) ×2 IMPLANT
SUT VICRYL 4-0 PS2 18IN ABS (SUTURE) ×4 IMPLANT
TOWEL BLUE STERILE X RAY DET (MISCELLANEOUS) ×4 IMPLANT
TOWEL OR 17X24 6PK STRL BLUE (TOWEL DISPOSABLE) ×4 IMPLANT
TOWEL OR 17X26 10 PK STRL BLUE (TOWEL DISPOSABLE) ×4 IMPLANT
TRAY FOLEY CATH 14FRSI W/METER (CATHETERS) ×2 IMPLANT
WATER STERILE IRR 1000ML POUR (IV SOLUTION) ×2 IMPLANT

## 2011-10-13 NOTE — Progress Notes (Signed)
Unusual presentation post op but doubt malignant hyperthermia as temp starting to decline as is HR Repeat ABG continues to show acidosis but CO2 is not increasing Will continue to monitor but no dantrolene intervention at this time Dr Gypsy Balsam

## 2011-10-13 NOTE — Progress Notes (Signed)
Asked by Dr. Arbie Cookey to evaluate pt s/p Aorto bifemoral BPG today.  He has a temp of 103 and is tachycardic at 130.  ABG shows a pH of 7.28 pCO2 44 and BE of -6. He is also in his third day of possible ETOH withdrawal.  I would elect to treat him with a fluid bolus to see if his heart rate is volume related and give him 2 amps of bicarb.  He does have a metabolic acidosis but is able to breathe down his CO2 such that I doubt he has malignant hyperthermia.  However, I would continue to follow his temp hourly x 6 and re check an ABG in 30 min.  CO2 would be important to follow as well as his acidotic state.   I will forward this information on to Dr. Gypsy Balsam for coverage tonight.

## 2011-10-13 NOTE — H&P (View-Only) (Signed)
Subjective: Interval History: none.Marland Kitchen less pain in his lower extremities  Objective: Vital signs in last 24 hours: Temp:  [97.4 F (36.3 C)-98.6 F (37 C)] 98.4 F (36.9 C) (09/19 0601) Pulse Rate:  [62-85] 62  (09/19 0601) Resp:  [16-19] 19  (09/19 0601) BP: (98-133)/(49-70) 133/70 mmHg (09/19 0601) SpO2:  [92 %-97 %] 97 % (09/19 0601) Weight:  [141 lb 12.1 oz (64.3 kg)] 141 lb 12.1 oz (64.3 kg) (09/19 0601)  Intake/Output from previous day:   Intake/Output this shift:    Comfortable. His feet have mild erythema bilaterally. Consistent with the severe chronic ischemia. Remains afebrile.  Lab Results:  Basename 10/10/11 1929 10/10/11 1544  WBC 16.4* 17.0*  HGB 12.0* 12.4*  HCT 34.0* 35.5*  PLT 455* 438*   BMET  Basename 10/10/11 1929 10/10/11 1544  NA 138 135  K 4.4 5.0  CL 101 100  CO2 25 24  GLUCOSE 79 95  BUN 13 14  CREATININE 0.82 0.87  CALCIUM 9.8 10.1    Studies/Results: Dg Chest 2 View  10/10/2011  *RADIOLOGY REPORT*  Clinical Data: 78 year old male preoperative study.  Cough shortness of breath. Smoker.  CHEST - 2 VIEW  Comparison: None.  Findings: Lung volumes within normal limits.  Cardiac size and mediastinal contours are within normal limits.  Visualized tracheal air column is within normal limits.  Diffuse increased interstitial markings, slightly more confluent about the left hilum and right infrahilar region.  Mild apical scarring.  No pneumothorax, edema, effusion or consolidation.  Mild lower thoracic compression deformity appears chronic.  IMPRESSION: Probable chronic lung changes, no definite acute cardiopulmonary abnormality.   Original Report Authenticated By: Harley Hallmark, M.D.    Dg Ankle 2 Views Right  10/10/2011  *RADIOLOGY REPORT*  Clinical Data: 78 year old male with pain.  Cellulitis of the foot. Redness extending up the right ankle.  RIGHT ANKLE - 2 VIEW  Comparison: Right foot series 06/22/2010.  Findings: Calcaneus intact.  Bone  mineralization about the right ankle is within normal limits.  No evidence of ankle joint effusion.  Talar dome intact.  No distal tibia or fibula fracture.  IMPRESSION: No acute osseous abnormality identified about the right ankle.   Original Report Authenticated By: Harley Hallmark, M.D.    Ct Angio Ao+bifem W/cm &/or Wo/cm  09/26/2011  *RADIOLOGY REPORT*  Clinical Data:  Iliac stenosis  CT ANGIOGRAPHY OF ABDOMINAL AORTA WITH ILIOFEMORAL RUNOFF  Technique:  Multidetector CT imaging of the abdomen, pelvis and lower extremities was performed using the standard protocol during bolus administration of intravenous contrast.  Multiplanar CT image reconstructions including MIPs were obtained to evaluate the vascular anatomy.  Contrast: OMNIPAQUE IOHEXOL 350 MG/ML SOLN  Comparison:  08/25/2010  Findings:  Motion artifact degrades the study.  Extensive plaque in the descending thoracic aorta without significant focal narrowing.  No significant change.  Plaque in the upper abdominal aorta, at the diaphragmatic hiatus without significant narrowing.  Juxtarenal plaque in the aorta is noted.  This has slightly progressed.  There is plaque in the infrarenal aorta with severe narrowing which has progressed.  There is now near occlusion of the infrarenal aorta. Prominence of the right inferior epigastric artery supports pre occlusion in the aorta.  This vessel has become more prominent.  Moderate narrowing at the origin of the celiac is stable.  SMA is patent.  Bilateral single renal arteries are grossly patent with mild atherosclerotic change. Severe narrowing at the origin of the IMA is stable.  Right common iliac artery remains occluded.  Right external iliac artery and internal iliac artery reconstitute and are severely diminutive with diffuse atherosclerotic change. Moderate long segment narrowing of the distal right external iliac artery.  Severe narrowing in the proximal left common iliac artery is stable.  Left  internal and external iliac arteries are severely diminutive with diffuse atherosclerotic change but no significant focal stenosis.  Right common femoral artery and superficial femoral artery are diminutive and patent with atherosclerotic changes.  There are more prominent atherosclerotic changes in the distal right superficial femoral artery.  Right popliteal artery is patent.  Two-vessel runoff to the right ankle via the peroneal and posterior tibial arteries.  Left common femoral artery is patent.  Profunda femoral arteries patent.  Moderate plaque at the origin of the profunda and superficial femoral arteries.  The superficial femoral artery is diminutive and patent with mild atherosclerotic changes.  There is a significant near occlusive stenosis of the left popliteal artery above the knee joint.  This has progressed since the prior study. One vessel runoff to the left ankle via the peroneal artery.  Focal atelectasis verses airspace disease at the posterior basal segment of the left lower lobe.  Liver, gallbladder, spleen, pancreas are within normal limits. Prominence of the adrenal glands without focal mass is stable.  Sub centimeter short axis diameter epigastric and gastrohepatic ligament nodes.  No abnormal adenopathy by measurement criteria.  Normal appendix.  No focal bowel wall thickening or evidence of bowel obstruction.  Negative free fluid.  Prominent prostate stable.  Severe L5 S1 degenerative disc disease.  Mild anterolisthesis L4 upon L5.  No pars defect.  Severe facet arthropathy at L4-5 and L5- S1.  Foraminal stenosis at these levels is present.  It is severe on the right at L4-5.   Review of the MIP images confirms the above findings.  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  It is nearly occluded.  There is irregular plaque throughout the visualized aorta.  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  Diffuse narrowing of the distal  right external iliac artery is not significantly changed.  Significant stenosis in the proximal left common iliac artery is stable.  Right lower extremity arterial structures are diffusely diminutive with two-vessel runoff.  Significant stenosis in the left popliteal artery above the knee joint has progressed with one vessel runoff to the left ankle.   Original Report Authenticated By: Donavan Burnet, M.D.    Nm Myocar Multi W/spect W/wall Motion / Ef  09/28/2011  The Lexiscan study was done under the supervision of the [facility] cardiology staff.  There were no EKG changes of ischemia noted.  The quality of the images is fair. There is some motion artefact on the stress images.  Perfusion images show small mid-inferior wall scar without significant peri-infarct ischemia.  The EDV is 66 ml.  The ESV is 27 ml.  The LV EF is 59%. There is mild basilar inferior wall hypokinesis.  Impression: Old inferior wall scar without significant peri-infarct ischemia.  Thomas A. Brackbill MD   Original Report Authenticated By: Johnsie Cancel Foot 2 Views Right  10/10/2011  *RADIOLOGY REPORT*  Clinical Data: 78 year old male with cellulitis pain and redness.  RIGHT FOOT - 2 VIEW  Comparison: Right foot series 06/22/2010.  Findings: Stable varus angulation at the fifth MTP joint.  Bone mineralization throughout the right foot appears stable since 2012. No osteolysis.  No  fracture or dislocation identified.  Joint spaces preserved.  No subcutaneous gas. No radiopaque foreign body identified.  IMPRESSION: Stable radiographic appearance of the right foot since 2012.   Original Report Authenticated By: Harley Hallmark, M.D.    Anti-infectives: Anti-infectives     Start     Dose/Rate Route Frequency Ordered Stop   10/13/11 0000   ceFAZolin (ANCEF) IVPB 1 g/50 mL premix     Comments: Send with pt to OR      1 g 100 mL/hr over 30 Minutes Intravenous On call 10/12/11 0744 10/14/11 0000   10/11/11 0600   cefUROXime (ZINACEF) 1.5  g in dextrose 5 % 50 mL IVPB        1.5 g 100 mL/hr over 30 Minutes Intravenous 30 min pre-op 10/10/11 1840     10/10/11 2200   piperacillin-tazobactam (ZOSYN) IVPB 3.375 g        3.375 g 12.5 mL/hr over 240 Minutes Intravenous 3 times per day 10/10/11 1812     10/10/11 1800   vancomycin (VANCOCIN) 1,250 mg in sodium chloride 0.9 % 250 mL IVPB        1,250 mg 166.7 mL/hr over 90 Minutes Intravenous Every 24 hours 10/10/11 1727            Assessment/Plan: s/p * No surgery found * 4 aortobifemoral bypass in the morning. I again explained the procedure and expected recovery with 1-2 days in the intensive care unit in proximally 5 days postoperatively.   LOS: 2 days   Deryn Massengale 10/12/2011, 7:45 AM

## 2011-10-13 NOTE — Progress Notes (Signed)
ANTIBIOTIC CONSULT NOTE - FOLLOW UP  Pharmacy Consult for Vancomycin Indication: Cellulitis (right foot)  Allergies  Allergen Reactions  . Lyrica (Pregabalin) Swelling    Swelling in feet and ankles.    Patient Measurements: Height: 5\' 7"  (170.2 cm) Weight: 143 lb 14.4 oz (65.273 kg) IBW/kg (Calculated) : 66.1   Vital Signs: Temp: 101.2 F (38.4 C) (09/20 1548) Temp src: Oral (09/20 0720) BP: 104/60 mmHg (09/20 1530) Pulse Rate: 102  (09/20 0720) Intake/Output from previous day: 09/19 0701 - 09/20 0700 In: 720 [P.O.:720] Out: 700 [Urine:700] Intake/Output from this shift: Total I/O In: 3800 [I.V.:2800; IV Piggyback:1000] Out: 1775 [Urine:1275; Blood:500]  Labs:  Basename 10/13/11 1400 10/13/11 1207 10/13/11 0440 10/10/11 1929  WBC 15.7* -- -- 16.4*  HGB 9.2* 8.5* -- 12.0*  PLT 263 -- -- 455*  LABCREA -- -- -- --  CREATININE 1.01 -- 0.99 0.82   Estimated Creatinine Clearance: 55.7 ml/min (by C-G formula based on Cr of 1.01). No results found for this basename: VANCOTROUGH:2,VANCOPEAK:2,VANCORANDOM:2,GENTTROUGH:2,GENTPEAK:2,GENTRANDOM:2,TOBRATROUGH:2,TOBRAPEAK:2,TOBRARND:2,AMIKACINPEAK:2,AMIKACINTROU:2,AMIKACIN:2, in the last 72 hours   Microbiology: Recent Results (from the past 720 hour(s))  CULTURE, BLOOD (ROUTINE X 2)     Status: Normal   Collection Time   09/24/11  6:25 PM      Component Value Range Status Comment   Specimen Description BLOOD LEFT HAND   Final    Special Requests BOTTLES DRAWN AEROBIC AND ANAEROBIC 10CC   Final    Culture  Setup Time 09/25/2011 05:37   Final    Culture NO GROWTH 5 DAYS   Final    Report Status 10/01/2011 FINAL   Final   CULTURE, BLOOD (ROUTINE X 2)     Status: Normal   Collection Time   09/24/11  6:40 PM      Component Value Range Status Comment   Specimen Description BLOOD LEFT HAND   Final    Special Requests BOTTLES DRAWN AEROBIC AND ANAEROBIC 10CC   Final    Culture  Setup Time 09/25/2011 05:37   Final    Culture NO  GROWTH 5 DAYS   Final    Report Status 10/01/2011 FINAL   Final   SURGICAL PCR SCREEN     Status: Normal   Collection Time   10/10/11 11:01 PM      Component Value Range Status Comment   MRSA, PCR NEGATIVE  NEGATIVE Final    Staphylococcus aureus NEGATIVE  NEGATIVE Final     Anti-infectives     Start     Dose/Rate Route Frequency Ordered Stop   10/13/11 0500   ceFAZolin (ANCEF) IVPB 1 g/50 mL premix     Comments: Send with pt to OR      1 g 100 mL/hr over 30 Minutes Intravenous On call 10/12/11 0744 10/13/11 0507   10/11/11 0600   cefUROXime (ZINACEF) 1.5 g in dextrose 5 % 50 mL IVPB        1.5 g 100 mL/hr over 30 Minutes Intravenous 30 min pre-op 10/10/11 1840 10/13/11 0626   10/10/11 2200   piperacillin-tazobactam (ZOSYN) IVPB 3.375 g  Status:  Discontinued        3.375 g 12.5 mL/hr over 240 Minutes Intravenous 3 times per day 10/10/11 1812 10/13/11 1538   10/10/11 1800   vancomycin (VANCOCIN) 1,250 mg in sodium chloride 0.9 % 250 mL IVPB        1,250 mg 166.7 mL/hr over 90 Minutes Intravenous Every 24 hours 10/10/11 1727  Assessment: Nathaniel Curtis is a 78 year old male on Vancomycin day #3 for cellulitis of the left foot. Renal function has remained stable since starting vancomycin. Noted patient had aortobifemoral bypass today. WBC 15.7, Tmax 101.2. No cultures drawn.   Goal of Therapy:  Vancomycin trough level 10-15 mcg/ml  Plan:  Continue vancomycin 1250mg  IV q24 hours Follow LOT and transition to oral antibiotics   Thank you,  Brett Fairy, PharmD 10/13/2011 4:11 PM

## 2011-10-13 NOTE — Interval H&P Note (Signed)
History and Physical Interval Note:  10/13/2011 9:36 AM  Nathaniel Curtis  has presented today for surgery, with the diagnosis of AIOD  The various methods of treatment have been discussed with the patient and family. After consideration of risks, benefits and other options for treatment, the patient has consented to  Procedure(s) (LRB) with comments: AORTA BIFEMORAL BYPASS GRAFT (N/A) as a surgical intervention .  The patient's history has been reviewed, patient examined, no change in status, stable for surgery.  I have reviewed the patient's chart and labs.  Questions were answered to the patient's satisfaction.     Armelia Penton

## 2011-10-13 NOTE — Anesthesia Postprocedure Evaluation (Signed)
  Anesthesia Post-op Note  Patient: Nathaniel Curtis  Procedure(s) Performed: Procedure(s) (LRB) with comments: AORTA BIFEMORAL BYPASS GRAFT (N/A) - Aortic bifemoral bypass with 18mm x 9mm graft.    Patient Location: PACU  Anesthesia Type: General  Level of Consciousness: awake  Airway and Oxygen Therapy: Patient Spontanous Breathing  Post-op Pain: mild  Post-op Assessment: Post-op Vital signs reviewed  Post-op Vital Signs: Reviewed  Complications: No apparent anesthesia complications

## 2011-10-13 NOTE — Transfer of Care (Signed)
Immediate Anesthesia Transfer of Care Note  Patient: Nathaniel Curtis  Procedure(s) Performed: Procedure(s) (LRB) with comments: AORTA BIFEMORAL BYPASS GRAFT (N/A) - Aortic bifemoral bypass with 18mm x 9mm graft.    Patient Location: PACU  Anesthesia Type: General  Level of Consciousness: awake and sedated  Airway & Oxygen Therapy: Patient Spontanous Breathing and Patient connected to face mask oxygen  Post-op Assessment: Report given to PACU RN, Post -op Vital signs reviewed and stable and Patient moving all extremities  Post vital signs: Reviewed and stable  Complications: No apparent anesthesia complications

## 2011-10-13 NOTE — Op Note (Signed)
OPERATIVE REPORT  DATE OF SURGERY: 10/13/2011  PATIENT: Nathaniel Curtis, 78 y.o. 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  PHYSICIAN ASSISTANT: Rhyne  ANESTHESIA:  Gen.  EBL: 500 ml  Total I/O In: 3800 [I.V.:2800; IV Piggyback:1000] Out: 1775 [Urine:1275; Blood:500]  BLOOD ADMINISTERED: Cell Saver only  DRAINS: None  SPECIMEN: None  COUNTS CORRECT:  YES  PLAN OF CARE: PACU extubated   PATIENT DISPOSITION:  PACU - hemodynamically stable  PROCEDURE DETAILS: The patient was taken to the operating room placed supine position where the area of both groins and the abdomen reprepped and draped in the usual sterile fashion. An incision was made from the level of the xiphoid to pass the umbilicus in the midline and carried down to the linea alba with electrocautery. The peritoneum was entered. The liver stomach small bowel large bowel were all normal. The Omni-Tract retractor was used for exposure. The transverse colon omentum reflected superiorly and the small bowel reflected to the right. The duodenum was mobilized off the aorta and dissection extended up to the level of the left renal vein. Left renal vein was mobilized to give exposure of the suprarenal aorta. The right renal artery was significantly lower than the left renal artery. Preoperative CT scan had shown extensive amount of mural thrombus in the aorta below the level of the left renal artery at the area of the right renal artery. Decision was made to clamp above the right and below the left renal artery for removal of this prior to anastomosis. The aorta was exposed above the level of bifurcation and tunnels were created on top of the external iliac taking care to stay behind the ureters bilaterally towards  the groins. Next separate incision was made over each groin and carried down to isolate the common superficial femoral and profundus femoris arteries bilaterally. The artery had significant posterior plaque but was adequate for anastomosis. The patient was given 25 g of mannitol and 7000 of intravenous heparin. After adequate circulation time the aorta was occluded above the level of the right renal artery below the level of renal artery. A Serafin clamp and then placed on the right renal artery target occlusion the aorta prevent embolus or debris into the right kidney. Distal aorta was occluded with a DeBakey clamp. The aorta was transected and the thrombus debris was removed from the lumen of the artery. The right renal artery lumen was visualized and was widely patent. Using a felt strip for reinforcement the aortobifemoral graft which was a 18 x 9 Hemashield graft was cut to appropriate length and was sewn end to end to the aorta with a running 3-0 Prolene suture. Anastomosis was tested and was adequate. The distal aorta then was likewise cleaned of debris and the distal aorta was oversewn with several layers of 3-0 Prolene suture. The limbs of the graft were then brought to the prior created tunnel to the right and left groin. The common superficial femoral and profundus wrist arteries were occluded bilaterally. The common femoral artery was opened on the anterior surface and extended with Potts scissors. Each limb of the graft was cut to appropriate length and was spatulated and sewn end-to-side to the artery with a running 6-0 Prolene suture. Prior to completion of each anastomosis the usual flushing maneuvers were undertaken. After completion  anastomosis clamps removed showing flow to the legs. The patient was given 50 mg of protamine to reverse the heparin. The wounds were irrigated with saline hemostasis was obtained electrocautery. The retroperitoneum was reclosed with a running 2-0 Vicryl suture to keep  the graft from being exposed to the bowel. The small bowel was run its entirety and found to be without injury was placed back in the pelvis. The transverse colon omentum were placed over this. The midline fascia was closed with #1 PDS suture beginning proximally and distally and tying in the middle. Both groins were closed with 2-0 Vicryl in the subcutaneous and subcuticular tissue. Skin was closed with a 3-0 Vicryl subcuticular sutures. Dressing was applied the patient was extubated transferred to the recovery in stable condition   Gretta Began, M.D. 10/13/2011 1:38 PM

## 2011-10-13 NOTE — Anesthesia Preprocedure Evaluation (Signed)
Anesthesia Evaluation  Patient identified by MRN, date of birth, ID band Patient awake    Reviewed: Allergy & Precautions, H&P , NPO status , Patient's Chart, lab work & pertinent test results  Airway Mallampati: I      Dental   Pulmonary neg pulmonary ROS,    + decreased breath sounds      Cardiovascular hypertension, Pt. on medications + Past MI and +CHF Rhythm:Regular Rate:Normal     Neuro/Psych negative neurological ROS     GI/Hepatic negative GI ROS, Neg liver ROS,   Endo/Other  negative endocrine ROS  Renal/GU negative Renal ROS     Musculoskeletal   Abdominal   Peds  Hematology negative hematology ROS (+)   Anesthesia Other Findings   Reproductive/Obstetrics                           Anesthesia Physical Anesthesia Plan  ASA: IV  Anesthesia Plan: General   Post-op Pain Management:    Induction: Intravenous  Airway Management Planned: Oral ETT  Additional Equipment: Arterial line and PA Cath  Intra-op Plan:   Post-operative Plan: Possible Post-op intubation/ventilation  Informed Consent: I have reviewed the patients History and Physical, chart, labs and discussed the procedure including the risks, benefits and alternatives for the proposed anesthesia with the patient or authorized representative who has indicated his/her understanding and acceptance.   Dental advisory given  Plan Discussed with: CRNA, Anesthesiologist and Surgeon  Anesthesia Plan Comments:         Anesthesia Quick Evaluation

## 2011-10-13 NOTE — Progress Notes (Signed)
Call to dr. Arbie Cookey to report unable to dopple pulse in left foot, cxray results and abg results. No new orders

## 2011-10-13 NOTE — Progress Notes (Signed)
Dr. Arbie Cookey made aware of temperature of 103.4 by Core.  Tylenol suppository already given per RN.   Dr. Arbie Cookey spoke with Dr. Ivin Booty, who came over to unit to see patient.  Made aware of vitals and interventions done so far.  ABG ordered and results called to Dr. Ivin Booty.  Order received to give NS bolus 250cc and to give 2 amps of sodium bicarb now, and to watch patient's temp closely.  Call Anesthesiologist if patient's temp does not improve in the next 2 hours.

## 2011-10-14 ENCOUNTER — Inpatient Hospital Stay (HOSPITAL_COMMUNITY): Payer: Medicare Other

## 2011-10-14 LAB — TYPE AND SCREEN
Antibody Screen: NEGATIVE
Unit division: 0

## 2011-10-14 LAB — CBC
HCT: 22.2 % — ABNORMAL LOW (ref 39.0–52.0)
Hemoglobin: 7.6 g/dL — ABNORMAL LOW (ref 13.0–17.0)
MCV: 93.7 fL (ref 78.0–100.0)
RBC: 2.37 MIL/uL — ABNORMAL LOW (ref 4.22–5.81)
WBC: 11.5 10*3/uL — ABNORMAL HIGH (ref 4.0–10.5)

## 2011-10-14 LAB — COMPREHENSIVE METABOLIC PANEL
Alkaline Phosphatase: 60 U/L (ref 39–117)
BUN: 13 mg/dL (ref 6–23)
CO2: 21 mEq/L (ref 19–32)
Chloride: 106 mEq/L (ref 96–112)
Creatinine, Ser: 1.18 mg/dL (ref 0.50–1.35)
GFR calc non Af Amer: 57 mL/min — ABNORMAL LOW (ref >90)
Potassium: 4.5 mEq/L (ref 3.5–5.1)
Total Bilirubin: 0.3 mg/dL (ref 0.3–1.2)

## 2011-10-14 LAB — AMYLASE: Amylase: 71 U/L (ref 0–105)

## 2011-10-14 LAB — MAGNESIUM: Magnesium: 2.2 mg/dL (ref 1.5–2.5)

## 2011-10-14 MED ORDER — SODIUM CHLORIDE 0.9 % IV SOLN
500.0000 mL | Freq: Once | INTRAVENOUS | Status: AC | PRN
  Administered 2011-10-14: 500 mL via INTRAVENOUS

## 2011-10-14 NOTE — Evaluation (Signed)
Physical Therapy Evaluation Patient Details Name: Nathaniel Curtis MRN: [id number] DOB: 1931/08/28 Today's Date: 10/14/2011 Time: 4098-1191 PT Time Calculation (min): 20 min  PT Assessment / Plan / Recommendation Clinical Impression  Pt s/p aorto-bifemoral bypass.  Pt lethargic during eval due to Ativan.  Also limited by painful feet.  Recommend ST-SNF.    PT Assessment  Patient needs continued PT services    Follow Up Recommendations  Skilled nursing facility    Barriers to Discharge Decreased caregiver support      Equipment Recommendations  Rolling walker with 5" wheels    Recommendations for Other Services     Frequency Min 3X/week    Precautions / Restrictions Precautions Precautions: Fall   Pertinent Vitals/Pain Painful feet      Mobility  Bed Mobility Bed Mobility: Sit to Supine Sit to Supine: 1: +2 Total assist Sit to Supine: Patient Percentage: 10% Transfers Transfers: Scientist, clinical (histocompatibility and immunogenetics) Transfers;Sit to Stand;Stand to Sit Sit to Stand: 1: +2 Total assist;From chair/3-in-1 Sit to Stand: Patient Percentage: 30% Stand to Sit: 1: +2 Total assist;To bed Stand to Sit: Patient Percentage: 30% Squat Pivot Transfers: 1: +2 Total assist Squat Pivot Transfers: Patient Percentage: 30% Details for Transfer Assistance: Verbal/tactile cues for pt to bring his feet back underneath him to be able to stand.      Exercises     PT Diagnosis: Difficulty walking;Generalized weakness;Acute pain  PT Problem List: Decreased strength;Decreased cognition;Decreased activity tolerance;Decreased balance;Decreased mobility;Decreased knowledge of use of DME;Pain PT Treatment Interventions: DME instruction;Gait training;Functional mobility training;Patient/family education;Therapeutic activities;Therapeutic exercise;Balance training   PT Goals Acute Rehab PT Goals PT Goal Formulation: Patient unable to participate in goal setting Time For Goal Achievement: 10/28/11 Potential to Achieve Goals:  Fair Pt will go Supine/Side to Sit: with min assist PT Goal: Supine/Side to Sit - Progress: Goal set today Pt will go Sit to Supine/Side: with min assist PT Goal: Sit to Supine/Side - Progress: Goal set today Pt will go Sit to Stand: with min assist PT Goal: Sit to Stand - Progress: Goal set today Pt will go Stand to Sit: with min assist PT Goal: Stand to Sit - Progress: Goal set today Pt will Ambulate: 16 - 50 feet;with min assist;with least restrictive assistive device PT Goal: Ambulate - Progress: Goal set today  Visit Information  Last PT Received On: 10/14/11 Assistance Needed: +2    Subjective Data  Subjective: "Alone," pt stated when asked if he lived alone. Patient Stated Goal: Pt unable to state due to lethargy after meds   Prior Functioning  Home Living Lives With: Alone Additional Comments: Pt unable to answer questions due to lethargy. Prior Function Comments: Pt unable to answer questions due to lethargy Communication Communication: Other (comment) (pt lethargic)    Cognition  Overall Cognitive Status: Difficult to assess Difficult to assess due to: Level of arousal Arousal/Alertness: Lethargic Behavior During Session: Lethargic Cognition - Other Comments: Pt had Ativan prior to eval    Extremity/Trunk Assessment Right Lower Extremity Assessment RLE ROM/Strength/Tone: Deficits;Due to pain RLE ROM/Strength/Tone Deficits: functionally <3/5 Left Lower Extremity Assessment LLE ROM/Strength/Tone: Deficits;Due to pain LLE ROM/Strength/Tone Deficits: functionally <3/5   Balance Static Sitting Balance Static Sitting - Balance Support: Bilateral upper extremity supported Static Sitting - Level of Assistance: 4: Min assist  End of Session PT - End of Session Equipment Utilized During Treatment: Gait belt Activity Tolerance: Patient limited by fatigue;Treatment limited secondary to medication Patient left: in bed;with call bell/phone within reach;with nursing in  room Nurse  Communication: Mobility status  GP     Nathaniel Curtis 10/14/2011, 4:39 PM  [facility] PT [phone]

## 2011-10-14 NOTE — Progress Notes (Signed)
[facility] [place]  Subjective  - No real complaints  Objective 78/45 100 98.6 F (37 C) (Core (Comment)) 18 97%  Intake/Output Summary (Last 24 hours) at 10/14/11 0804 Last data filed at 10/14/11 0700  Gross per 24 hour  Intake 7955.5 ml  Output   2845 ml  Net 5110.5 ml  BP > 100 now  Right PT pulse Left AT/peroneal doppler Abdomen soft Groin incisions dry  Assessment/Planning: S/P aortobifem overall well Acute blood loss anemia observe for now asymptomatic Pulmonary toilet  OOB D/c swan aline D/c foley a.m. Keep NG for now  Sabree Nuon E 10/14/2011 8:04 AM --  Laboratory Lab Results:  Basename 10/14/11 0346 10/13/11 1400  WBC 11.5* 15.7*  HGB 7.6* 9.2*  HCT 22.2* 27.3*  PLT 178 263   BMET  Basename 10/14/11 0346 10/13/11 1400  NA 136 135  K 4.5 4.8  CL 106 104  CO2 21 23  GLUCOSE 89 92  BUN 13 11  CREATININE 1.18 1.01  CALCIUM 8.1* 9.1    COAG Lab Results  Component Value Date   INR 1.41 10/13/2011   INR 1.10 10/10/2011   No results found for this basename: PTT    Antibiotics Anti-infectives     Start     Dose/Rate Route Frequency Ordered Stop   10/13/11 1700   cefUROXime (ZINACEF) 1.5 g in dextrose 5 % 50 mL IVPB        1.5 g 100 mL/hr over 30 Minutes Intravenous Every 12 hours 10/13/11 1613 10/14/11 0530   10/13/11 0500   ceFAZolin (ANCEF) IVPB 1 g/50 mL premix     Comments: Send with pt to OR      1 g 100 mL/hr over 30 Minutes Intravenous On call 10/12/11 0744 10/13/11 0507   10/11/11 0600   cefUROXime (ZINACEF) 1.5 g in dextrose 5 % 50 mL IVPB        1.5 g 100 mL/hr over 30 Minutes Intravenous 30 min pre-op 10/10/11 1840 10/13/11 0626   10/10/11 2200   piperacillin-tazobactam (ZOSYN) IVPB 3.375 g  Status:  Discontinued        3.375 g 12.5 mL/hr over 240 Minutes Intravenous 3 times per day 10/10/11 1812 10/13/11 1538   10/10/11 1800   vancomycin (VANCOCIN) 1,250 mg in sodium chloride 0.9 % 250 mL  IVPB        1,250 mg 166.7 mL/hr over 90 Minutes Intravenous Every 24 hours 10/10/11 1727

## 2011-10-15 LAB — PREPARE RBC (CROSSMATCH)

## 2011-10-15 LAB — BASIC METABOLIC PANEL
CO2: 19 mEq/L (ref 19–32)
Chloride: 111 mEq/L (ref 96–112)
Creatinine, Ser: 0.76 mg/dL (ref 0.50–1.35)
GFR calc Af Amer: >90 mL/min (ref >90)
Potassium: 3.8 mEq/L (ref 3.5–5.1)

## 2011-10-15 LAB — CBC
MCV: 94.1 fL (ref 78.0–100.0)
Platelets: 137 10*3/uL — ABNORMAL LOW (ref 150–400)
RBC: 2.03 MIL/uL — ABNORMAL LOW (ref 4.22–5.81)
RDW: 15.3 % (ref 11.5–15.5)
WBC: 13.3 10*3/uL — ABNORMAL HIGH (ref 4.0–10.5)

## 2011-10-15 MED ORDER — POTASSIUM CHLORIDE 10 MEQ/100ML IV SOLN
10.0000 meq | INTRAVENOUS | Status: AC
  Administered 2011-10-15 (×2): 10 meq via INTRAVENOUS
  Filled 2011-10-15 (×2): qty 100

## 2011-10-15 MED ORDER — FUROSEMIDE 10 MG/ML IJ SOLN
INTRAMUSCULAR | Status: AC
  Filled 2011-10-15: qty 4

## 2011-10-15 MED ORDER — FUROSEMIDE 10 MG/ML IJ SOLN
20.0000 mg | Freq: Once | INTRAMUSCULAR | Status: AC
  Administered 2011-10-15: 20 mg via INTRAVENOUS

## 2011-10-15 NOTE — Progress Notes (Signed)
[facility] [place]  Subjective  - POD #2, feet more sore today  Objective 121/55 103 97.8 F (36.6 C) (Oral) 22 100%  Intake/Output Summary (Last 24 hours) at 10/15/11 0841 Last data filed at 10/15/11 0715  Gross per 24 hour  Intake 3528.72 ml  Output   1775 ml  Net 1753.72 ml   NG minimal Abdomen soft incision clean Feet pink warm bilat groin incisions clean  CBC    Component Value Date/Time   WBC 13.3* 10/15/2011 0355   RBC 2.03* 10/15/2011 0355   HGB 6.4* 10/15/2011 0355   HCT 19.1* 10/15/2011 0355   PLT 137* 10/15/2011 0355   MCV 94.1 10/15/2011 0355   MCH 31.5 10/15/2011 0355   MCHC 33.5 10/15/2011 0355   RDW 15.3 10/15/2011 0355   LYMPHSABS 1.2 10/10/2011 1544   MONOABS 1.2* 10/10/2011 1544   EOSABS 0.3 10/10/2011 1544   BASOSABS 0.1 10/10/2011 1544      Assessment/Planning: Improving daily  Acute blood loss anemia transfuse Diurese gently today D/c foley D.c NG Decrease IV Fluid Transfer 2000  Jonnatan Hanners E 10/15/2011 8:41 AM --  Laboratory Lab Results:  Basename 10/15/11 0355 10/14/11 0346  WBC 13.3* 11.5*  HGB 6.4* 7.6*  HCT 19.1* 22.2*  PLT 137* 178   BMET  Basename 10/15/11 0355 10/14/11 0346  NA 139 136  K 3.8 4.5  CL 111 106  CO2 19 21  GLUCOSE 91 89  BUN 12 13  CREATININE 0.76 1.18  CALCIUM 7.2* 8.1*    COAG Lab Results  Component Value Date   INR 1.41 10/13/2011   INR 1.10 10/10/2011   No results found for this basename: PTT    Antibiotics Anti-infectives     Start     Dose/Rate Route Frequency Ordered Stop   10/13/11 1700   cefUROXime (ZINACEF) 1.5 g in dextrose 5 % 50 mL IVPB        1.5 g 100 mL/hr over 30 Minutes Intravenous Every 12 hours 10/13/11 1613 10/14/11 0530   10/13/11 0500   ceFAZolin (ANCEF) IVPB 1 g/50 mL premix     Comments: Send with pt to OR      1 g 100 mL/hr over 30 Minutes Intravenous On call 10/12/11 0744 10/13/11 0507   10/11/11 0600   cefUROXime (ZINACEF) 1.5 g in  dextrose 5 % 50 mL IVPB        1.5 g 100 mL/hr over 30 Minutes Intravenous 30 min pre-op 10/10/11 1840 10/13/11 0626   10/10/11 2200   piperacillin-tazobactam (ZOSYN) IVPB 3.375 g  Status:  Discontinued        3.375 g 12.5 mL/hr over 240 Minutes Intravenous 3 times per day 10/10/11 1812 10/13/11 1538   10/10/11 1800   vancomycin (VANCOCIN) 1,250 mg in sodium chloride 0.9 % 250 mL IVPB        1,250 mg 166.7 mL/hr over 90 Minutes Intravenous Every 24 hours 10/10/11 1727

## 2011-10-15 NOTE — Progress Notes (Signed)
ANTIBIOTIC CONSULT NOTE - FOLLOW UP  Pharmacy Consult for Vancomycin Indication: Cellulitis (right foot)  Allergies  Allergen Reactions  . Lyrica (Pregabalin) Swelling    Swelling in feet and ankles.   Labs:  [facility] 10/15/11 0355 10/14/11 0346 10/13/11 1400  WBC 13.3* 11.5* 15.7*  HGB 6.4* 7.6* 9.2*  PLT 137* 178 263  [facility] -- -- --  CREATININE 0.76 1.18 1.01   Estimated Creatinine Clearance: 71.1 ml/min (by C-G formula based on Cr of 0.76).  Basename 10/15/11 1825  VANCOTROUGH 10.4  VANCOPEAK --  VANCORANDOM --  GENTTROUGH --  GENTPEAK --  GENTRANDOM --  TOBRATROUGH --  TOBRAPEAK --  TOBRARND --  AMIKACINPEAK --  AMIKACINTROU --  AMIKACIN --      Assessment: Nathaniel Curtis is a 78 year old male on Vancomycin day #3 for cellulitis of the left foot. Renal function has remained stable since starting vancomycin.   Vancomycin trough is therapeutic  Goal of Therapy:  Vancomycin trough level 10-15 mcg/ml  Plan:  Continue vancomycin 1250mg  IV q24 hours Follow LOT and transition to oral antibiotics   Thank you,  Okey Regal, PharmD 10/15/2011 7:15 PM

## 2011-10-15 NOTE — Progress Notes (Signed)
CRITICAL VALUE ALERT  Critical value received:  Hemoglobin 6.4  Date of notification:  10/15/2011  Time of notification:  0430  Critical value read back: Yes  Nurse who received alert: A.Drucella Karbowski RN  MD notified (1st page):  Dr. Darrick Penna   Time of first page:  [phone]  MD notified (2nd page):  Time of second page:  Responding MD:  Dr. Darrick Penna   Time MD responded:

## 2011-10-16 LAB — TYPE AND SCREEN
ABO/RH(D): B POS
Antibody Screen: NEGATIVE
Unit division: 0
Unit division: 0

## 2011-10-16 LAB — BASIC METABOLIC PANEL WITH GFR
BUN: 13 mg/dL (ref 6–23)
CO2: 26 meq/L (ref 19–32)
Calcium: 8.6 mg/dL (ref 8.4–10.5)
Chloride: 104 meq/L (ref 96–112)
Creatinine, Ser: 0.81 mg/dL (ref 0.50–1.35)
GFR calc Af Amer: >90 mL/min
GFR calc non Af Amer: 83 mL/min — ABNORMAL LOW
Glucose, Bld: 80 mg/dL (ref 70–99)
Potassium: 3.9 meq/L (ref 3.5–5.1)
Sodium: 138 meq/L (ref 135–145)

## 2011-10-16 LAB — CBC
HCT: 26.4 % — ABNORMAL LOW (ref 39.0–52.0)
Hemoglobin: 9.4 g/dL — ABNORMAL LOW (ref 13.0–17.0)
MCH: 31.6 pg (ref 26.0–34.0)
MCHC: 35.6 g/dL (ref 30.0–36.0)
MCV: 88.9 fL (ref 78.0–100.0)
Platelets: 154 K/uL (ref 150–400)
RBC: 2.97 MIL/uL — ABNORMAL LOW (ref 4.22–5.81)
RDW: 16.5 % — ABNORMAL HIGH (ref 11.5–15.5)
WBC: 11.6 K/uL — ABNORMAL HIGH (ref 4.0–10.5)

## 2011-10-16 NOTE — Progress Notes (Signed)
Physical Therapy Treatment Patient Details Name: Nathaniel Curtis MRN: [id number] DOB: 01-Mar-1931 Today's Date: 10/16/2011 Time: 4098-1191 PT Time Calculation (min): 26 min  PT Assessment / Plan / Recommendation Comments on Treatment Session  Pt s/p aortobifem bypass.  Pt making good progress with mobility.    Follow Up Recommendations  Skilled nursing facility    Barriers to Discharge        Equipment Recommendations  Rolling walker with 5" wheels    Recommendations for Other Services    Frequency Min 3X/week   Plan Discharge plan remains appropriate;Frequency remains appropriate    Precautions / Restrictions Precautions Precautions: Fall Restrictions Weight Bearing Restrictions: No   Pertinent Vitals/Pain VSS    Mobility  Transfers Sit to Stand: With upper extremity assist;From bed;From toilet;4: Min assist Stand to Sit: With upper extremity assist;To toilet;To chair/3-in-1;4: Min assist Details for Transfer Assistance: verbal/tactile cues for hand placement Ambulation/Gait Ambulation/Gait Assistance: 4: Min assist Ambulation Distance (Feet): 30 Feet (15' x 2) Assistive device: Rolling walker Ambulation/Gait Assistance Details: verbal cues for correct use of walker.  Pt still pushing walker too far in front of him and leaving walker behind when turning to him. Gait Pattern: Trunk flexed;Decreased stride length;Step-through pattern    Exercises     PT Diagnosis:    PT Problem List:   PT Treatment Interventions:     PT Goals Acute Rehab PT Goals Pt will go Sit to Stand: with modified independence PT Goal: Sit to Stand - Progress: Updated due to goal met Pt will go Stand to Sit: with modified independence PT Goal: Stand to Sit - Progress: Updated due to goals met Pt will Ambulate: 51 - 150 feet;with supervision;with least restrictive assistive device PT Goal: Ambulate - Progress: Updated due to goal met  Visit Information  Last PT Received On: 10/16/11 Assistance  Needed: +1 PT/OT Co-Evaluation/Treatment: Yes    Subjective Data  Subjective: "I want to go to the crapper."   Cognition  Overall Cognitive Status: Impaired Area of Impairment: Safety/judgement;Awareness of deficits Arousal/Alertness: Awake/alert Orientation Level: Oriented X4 / Intact Behavior During Session: [facility] for tasks performed Safety/Judgement: Decreased awareness of safety precautions;Decreased awareness of need for assistance Cognition - Other Comments: Pt thinking the IV pole needs to be hooked up to the computer.    Balance  Static Standing Balance Static Standing - Balance Support: No upper extremity supported;During functional activity Static Standing - Level of Assistance: 5: Stand by assistance  End of Session PT - End of Session Equipment Utilized During Treatment: Gait belt Activity Tolerance: Patient tolerated treatment well Patient left: in chair;with call bell/phone within reach Nurse Communication: Mobility status   GP     [facility] 10/16/2011, 9:43 AM  [facility] PT [phone]

## 2011-10-16 NOTE — Progress Notes (Addendum)
[facility] Progress Note  10/16/2011 7:17 AM POD 3  Subjective:  Very hungry and very thirsty  Tm 99.1 HR 90's-100's reg 100's-140's systolic 97%2LO2NC  Filed Vitals:   10/16/11 0453  BP: 134/57  Pulse: 100  Temp: 98.9 F (37.2 C)  Resp: 20     Physical Exam: Cardiac:  RRR Lungs:  CTAB Abdomen:  Soft NT/ND +BS;  + but minimal flatus Incisions:  Abdominal and bilateral groin incisions with steri strips in place   CBC    Component Value Date/Time   WBC 11.6* 10/16/2011 0520   RBC 2.97* 10/16/2011 0520   HGB 9.4* 10/16/2011 0520   HCT 26.4* 10/16/2011 0520   PLT 154 10/16/2011 0520   MCV 88.9 10/16/2011 0520   MCH 31.6 10/16/2011 0520   MCHC 35.6 10/16/2011 0520   RDW 16.5* 10/16/2011 0520   LYMPHSABS 1.2 10/10/2011 1544   MONOABS 1.2* 10/10/2011 1544   EOSABS 0.3 10/10/2011 1544   BASOSABS 0.1 10/10/2011 1544    BMET    Component Value Date/Time   NA 138 10/16/2011 0520   K 3.9 10/16/2011 0520   CL 104 10/16/2011 0520   CO2 26 10/16/2011 0520   GLUCOSE 80 10/16/2011 0520   BUN 13 10/16/2011 0520   CREATININE 0.81 10/16/2011 0520   CALCIUM 8.6 10/16/2011 0520   GFRNONAA 83* 10/16/2011 0520   GFRAA >90 10/16/2011 0520    INR    Component Value Date/Time   INR 1.41 10/13/2011 1400     Intake/Output Summary (Last 24 hours) at 10/16/11 0717 Last data filed at 10/16/11 2536  Gross per 24 hour  Intake  932.5 ml  Output   2375 ml  Net -1442.5 ml     Assessment/Plan:  78 y.o. male is s/p Aortobifemoral bypass with a 18 x 9 Hemashield graft  POD 3  -acute blood loss anemia improved with transfusion -WBC improved-on Vancomycin -slowly increase diet -continue PCA today -mobilize/incentive spirometry-wean O2  Doreatha Massed, PA-C [facility] [phone] 10/16/2011 7:17 AM

## 2011-10-16 NOTE — Evaluation (Signed)
Occupational Therapy Evaluation Patient Details Name: Nathaniel Curtis MRN: [id number] DOB: 11-Jan-1932 Today's Date: 10/16/2011 Time: 4098-1191 OT Time Calculation (min): 36 min  OT Assessment / Plan / Recommendation Clinical Impression  Pt admitted for aortobifemoral graft.  Per chart and PT's report, pt has made steady gains in mobility and cognition since last visit.  Pt continues to demonstrate the below deficits for which he will benefit from OT.  Will need SNF at d/c for short term rehab and is agreeable.    OT Assessment  Patient needs continued OT Services    Follow Up Recommendations  Skilled nursing facility    Barriers to Discharge Decreased caregiver support    Equipment Recommendations  Rolling walker with 5" wheels    Recommendations for Other Services    Frequency  Min 2X/week    Precautions / Restrictions Precautions Precautions: Fall Restrictions Weight Bearing Restrictions: No   Pertinent Vitals/Pain     ADL  Eating/Feeding: Performed;Independent Where Assessed - Eating/Feeding: Edge of bed Grooming: Performed;Wash/dry hands;Minimal assistance Where Assessed - Grooming: Unsupported standing Upper Body Bathing: Simulated;Supervision/safety Where Assessed - Upper Body Bathing: Unsupported sitting Lower Body Bathing: Simulated;Minimal assistance Where Assessed - Lower Body Bathing: Supported sit to stand Upper Body Dressing: Simulated;Set up Where Assessed - Upper Body Dressing: Unsupported sitting Lower Body Dressing: Performed;Minimal assistance Where Assessed - Lower Body Dressing: Unsupported sitting;Supported sit to stand Toilet Transfer: Performed;Minimal assistance Toilet Transfer Method: Sit to stand Toilet Transfer Equipment: Regular height toilet;Grab bars Toileting - Clothing Manipulation and Hygiene: Performed;Maximal assistance Where Assessed - Engineer, mining and Hygiene: Sit to stand from 3-in-1 or toilet Equipment Used: Gait  belt;Rolling walker Transfers/Ambulation Related to ADLs: min assist with RW, max cues for technique ADL Comments: Pt limited in mobility for ADL by multiple lines on UEs.    OT Diagnosis: Generalized weakness;Cognitive deficits;Acute pain  OT Problem List: Decreased activity tolerance;Impaired balance (sitting and/or standing);Decreased knowledge of use of DME or AE;Pain;Impaired UE functional use;Decreased safety awareness;Decreased cognition OT Treatment Interventions: Self-care/ADL training;Patient/family education;DME and/or AE instruction   OT Goals Acute Rehab OT Goals OT Goal Formulation: With patient Time For Goal Achievement: 10/30/11 Potential to Achieve Goals: Good ADL Goals Pt Will Perform Grooming: with supervision;Standing at sink ADL Goal: Grooming - Progress: Goal set today Pt Will Perform Lower Body Bathing: with supervision;Sit to stand from bed ADL Goal: Lower Body Bathing - Progress: Goal set today Pt Will Perform Lower Body Dressing: with supervision;Sit to stand from bed ADL Goal: Lower Body Dressing - Progress: Goal set today Pt Will Transfer to Toilet: with supervision;Regular height toilet ADL Goal: Toilet Transfer - Progress: Goal set today Pt Will Perform Toileting - Clothing Manipulation: with supervision;Standing ADL Goal: Toileting - Clothing Manipulation - Progress: Goal set today Pt Will Perform Toileting - Hygiene: with supervision;Sit to stand from 3-in-1/toilet ADL Goal: Toileting - Hygiene - Progress: Goal set today Miscellaneous OT Goals Miscellaneous OT Goal #1: Pt will request assistance for OOB activity to decrease fall risk. OT Goal: Miscellaneous Goal #1 - Progress: Goal set today  Visit Information  Last OT Received On: 10/16/11 Assistance Needed: +1 PT/OT Co-Evaluation/Treatment: Yes    Subjective Data  Subjective: "I need to go to the crapper." Patient Stated Goal: Pt agreeable to rehab prior to return home.   Prior  Functioning  Vision/Perception  Home Living Lives With: Alone Additional Comments: Plan is for SNF.  Pt's daughter plans to return to [place]. Prior Function Level of Independence: Independent Driving: Yes Vocation:  Retired Musician: No difficulties Dominant Hand: Right      Cognition  Overall Cognitive Status: Impaired Area of Impairment: Safety/judgement;Awareness of deficits Arousal/Alertness: Awake/alert Orientation Level: Appears intact for tasks assessed Behavior During Session: [facility] for tasks performed Safety/Judgement: Decreased awareness of safety precautions;Decreased awareness of need for assistance Cognition - Other Comments: Pt thinking the IV pole needs to be hooked up to the computer.    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   Mobility  Shoulder Instructions  Bed Mobility Bed Mobility: Sit to Supine Sit to Supine: 6: Modified independent (Device/Increase time) Transfers Transfers: Sit to Stand;Stand to Sit Sit to Stand: From toilet;From bed;With upper extremity assist;4: Min assist Stand to Sit: With upper extremity assist;To toilet;To chair/3-in-1;4: Min assist Details for Transfer Assistance: verbal/tactile cues for hand placement       Exercise     Balance Static Standing Balance Static Standing - Balance Support: No upper extremity supported;During functional activity Static Standing - Level of Assistance: 5: Stand by assistance   End of Session OT - End of Session Activity Tolerance: Patient tolerated treatment well Patient left: in chair;with call bell/phone within reach  GO     Evern Bio 10/16/2011, 11:07 AM [phone]

## 2011-10-17 ENCOUNTER — Encounter (HOSPITAL_COMMUNITY): Payer: Self-pay | Admitting: Vascular Surgery

## 2011-10-17 LAB — CREATININE, SERUM: Creatinine, Ser: 0.75 mg/dL (ref 0.50–1.35)

## 2011-10-17 MED ORDER — OXYCODONE-ACETAMINOPHEN 5-325 MG PO TABS
1.0000 | ORAL_TABLET | ORAL | Status: DC | PRN
  Administered 2011-10-17 – 2011-10-19 (×9): 2 via ORAL
  Filled 2011-10-17 (×9): qty 2

## 2011-10-17 MED ORDER — SILVER SULFADIAZINE 1 % EX CREA
TOPICAL_CREAM | Freq: Two times a day (BID) | CUTANEOUS | Status: DC
  Administered 2011-10-17 – 2011-10-19 (×5): via TOPICAL
  Filled 2011-10-17 (×2): qty 85

## 2011-10-17 MED FILL — Heparin Sodium (Porcine) Inj 1000 Unit/ML: INTRAMUSCULAR | Qty: 30 | Status: AC

## 2011-10-17 MED FILL — Sodium Chloride IV Soln 0.9%: INTRAVENOUS | Qty: 1000 | Status: AC

## 2011-10-17 MED FILL — Sodium Chloride Irrigation Soln 0.9%: Qty: 3000 | Status: AC

## 2011-10-17 NOTE — Progress Notes (Addendum)
[facility] [place]  Progress Note Bypass Surgery  Date of Surgery: 10/10/2011 - 10/13/2011  Procedure(s): AORTA BIFEMORAL BYPASS GRAFT Surgeon: Surgeon(s): Larina Earthly, MD  4 Days Post-Op  History of Present Illness  Nathaniel Curtis is a 78 y.o. male who is S/P Procedure(s): AORTA BIFEMORAL BYPASS GRAFT bilateral.  The patient's pre-op symptoms of pain are Improved . Patients pain is well controlled.               Imaging: No results found.  Significant Diagnostic Studies: CBC Lab Results  Component Value Date   WBC 11.6* 10/16/2011   HGB 9.4* 10/16/2011   HCT 26.4* 10/16/2011   MCV 88.9 10/16/2011   PLT 154 10/16/2011    BMET     Component Value Date/Time   NA 138 10/16/2011 0520   K 3.9 10/16/2011 0520   CL 104 10/16/2011 0520   CO2 26 10/16/2011 0520   GLUCOSE 80 10/16/2011 0520   BUN 13 10/16/2011 0520   CREATININE 0.75 10/17/2011 0500   CALCIUM 8.6 10/16/2011 0520   GFRNONAA 86* 10/17/2011 0500   GFRAA >90 10/17/2011 0500    COAG Lab Results  Component Value Date   INR 1.41 10/13/2011   INR 1.10 10/10/2011   No results found for this basename: PTT    Physical Examination  BP Readings from Last 3 Encounters:  10/17/11 114/56  10/17/11 114/56  09/29/11 130/59   Temp Readings from Last 3 Encounters:  10/17/11 98.5 F (36.9 C) Oral  10/17/11 98.5 F (36.9 C) Oral  09/29/11 98.4 F (36.9 C) Oral   SpO2 Readings from Last 3 Encounters:  10/17/11 98%  10/17/11 98%  09/29/11 95%   Pulse Readings from Last 3 Encounters:  10/17/11 87  10/17/11 87  09/29/11 76    Pt is A&O x 3  Incision/s is/are clean,dry.intact, and  healing without hematoma, erythema or drainage Limb is warm; with good color Palpable PT pulses bilateral Dorsum foot wound healing   Assessment/Plan: Start percocet this am plan to d/c PCA in am Advance to regular diet today silvadene dressing daily to right dorsum of foot  Pt. Doing well Post-op pain is  controlled Wounds are clean, dry, intact or healing well PT/OT for ambulation Continue wound care as ordered  Mosetta Pigeon [phone] 10/17/2011 7:28 AM        I have examined the patient, reviewed and agree with above. Palpable popliteal pulses bilaterally. Groin and abdominal skin incisions healing nicely. We'll advance diet. Danyal Whitenack, MD 10/17/2011 7:39 AM

## 2011-10-18 MED ORDER — PANTOPRAZOLE SODIUM 40 MG PO TBEC
40.0000 mg | DELAYED_RELEASE_TABLET | Freq: Every day | ORAL | Status: DC
  Administered 2011-10-18: 40 mg via ORAL
  Filled 2011-10-18: qty 1

## 2011-10-18 NOTE — Progress Notes (Addendum)
[facility] Progress Note  10/18/2011 7:23 AM POD 5  Subjective:  Feeling better.  States he has not used morphine in past 24 hours.  Wants to go home and states he has help at home.  Afebrile x 24 hours   HR  80's-100's regular 100's-130's systolic 99%2LO2NC  Filed Vitals:   10/18/11 0514  BP: 136/74  Pulse: 86  Temp: 98 F (36.7 C)  Resp: 18     Physical Exam: Cardiac:  RRR Lungs:  CTAB Abdomen:  Soft, NT/ND +BM yesterday Incisions:  C/d/i with some old bloody drainage at the distal portion of the incision.   CBC    Component Value Date/Time   WBC 11.6* 10/16/2011 0520   RBC 2.97* 10/16/2011 0520   HGB 9.4* 10/16/2011 0520   HCT 26.4* 10/16/2011 0520   PLT 154 10/16/2011 0520   MCV 88.9 10/16/2011 0520   MCH 31.6 10/16/2011 0520   MCHC 35.6 10/16/2011 0520   RDW 16.5* 10/16/2011 0520   LYMPHSABS 1.2 10/10/2011 1544   MONOABS 1.2* 10/10/2011 1544   EOSABS 0.3 10/10/2011 1544   BASOSABS 0.1 10/10/2011 1544    BMET    Component Value Date/Time   NA 138 10/16/2011 0520   K 3.9 10/16/2011 0520   CL 104 10/16/2011 0520   CO2 26 10/16/2011 0520   GLUCOSE 80 10/16/2011 0520   BUN 13 10/16/2011 0520   CREATININE 0.75 10/17/2011 0500   CALCIUM 8.6 10/16/2011 0520   GFRNONAA 86* 10/17/2011 0500   GFRAA >90 10/17/2011 0500    INR    Component Value Date/Time   INR 1.41 10/13/2011 1400     Intake/Output Summary (Last 24 hours) at 10/18/11 0723 Last data filed at 10/17/11 1730  Gross per 24 hour  Intake    720 ml  Output    400 ml  Net    320 ml     Assessment/Plan:  78 y.o. male is s/p Aortobifemoral bypass with a 18 x 9 Hemashield graft  POD 5  -PT/OT recommend SNF at discharge, but pt wants to go home. -d/c IVF and PCA -continue percocet -continue PT/OT and mobilization -continue silvadene-pt states his right foot feels better with this. -? Restart plavix-will d/w Dr. Anne Hahn, PA-C [facility] [phone] 10/18/2011 7:23 AM  I have examined the patient, reviewed and agree with above. Comfortable. Tolerating diet.  Plan dc in AM with HHN.dc Zosyn and Vanc  Soledad Budreau, MD 10/18/2011 1:26 PM

## 2011-10-18 NOTE — Clinical Social Work Placement (Addendum)
Clinical Social Work Department CLINICAL SOCIAL WORK PLACEMENT NOTE 10/18/2011  Patient:  [facility]  Account Number:  [id number] Admit date:  10/10/2011  Clinical Social Worker:  Read Drivers  Date/time:  10/17/2011 08:52 AM  Clinical Social Work is seeking post-discharge placement for this patient at the following level of care:   SKILLED NURSING   (*CSW will update this form in Epic as items are completed)   10/17/2011  Patient/family provided with [facility] Department of Clinical Social Work's list of facilities offering this level of care within the geographic area requested by the patient (or if unable, by the patient's family).  10/17/2011  Patient/family informed of their freedom to choose among providers that offer the needed level of care, that participate in Medicare, Medicaid or managed care program needed by the patient, have an available bed and are willing to accept the patient.  10/17/2011  Patient/family informed of [facility]' ownership interest in [facility], as well as of the fact that they are under no obligation to receive care at this facility.  PASARR submitted to EDS on 10/17/2011 PASARR number received from EDS on 10/18/2011  FL2 transmitted to all facilities in geographic area requested by pt/family on  10/18/2011 FL2 transmitted to all facilities within larger geographic area on   Patient informed that his/her managed care company has contracts with or will negotiate with  certain facilities, including the following:     Patient/family informed of bed offers received:  PT WILL D/C HOME WITH HOME HEALTH SERVICES Patient chooses bed at [facility] PT REFUSES SNF Physician recommends and patient chooses bed at  [facility]  Patient to be transferred to  on  HOME Patient to be transferred to facility by FAMILY  The following physician request were entered in Epic:   Additional Comments:  Vickii Penna, LCSWA [phone]  Clinical Social  Work

## 2011-10-18 NOTE — Progress Notes (Signed)
ANTIBIOTIC CONSULT NOTE - FOLLOW UP  Pharmacy Consult for Vancomycin Indication: Cellulitis (right foot)  Allergies  Allergen Reactions  . Lyrica (Pregabalin) Swelling    Swelling in feet and ankles.   Labs:  Basename 10/17/11 0500 10/16/11 0520  WBC -- 11.6*  HGB -- 9.4*  PLT -- 154  LABCREA -- --  CREATININE 0.75 0.81   Estimated Creatinine Clearance: 71.1 ml/min (by C-G formula based on Cr of 0.75).  Basename 10/15/11 1825  VANCOTROUGH 10.4  VANCOPEAK --  VANCORANDOM --  GENTTROUGH --  GENTPEAK --  GENTRANDOM --  TOBRATROUGH --  TOBRAPEAK --  TOBRARND --  AMIKACINPEAK --  AMIKACINTROU --  AMIKACIN --      Assessment: Nathaniel Curtis is a 78 year old male on Vancomycin day #9 for cellulitis of the left foot. Renal function has remained stable since starting vancomycin. His Vancomycin trough was therapeutic on 9/22.  Goal of Therapy:  Vancomycin trough level 10-15 mcg/ml  Plan:  Continue vancomycin 1250mg  IV q24 hours Follow LOT and transition to oral antibiotics   Estella Husk, Pharm.D., BCPS Clinical Pharmacist  Phone [phone] Pager [phone] 10/18/2011, 9:52 AM

## 2011-10-18 NOTE — Progress Notes (Signed)
Physical Therapy Treatment Patient Details Name: Nathaniel Curtis MRN: [id number] DOB: May 20, 1931 Today's Date: 10/18/2011 Time: 0850-0906 PT Time Calculation (min): 16 min  PT Assessment / Plan / Recommendation Comments on Treatment Session  Pt s/p aortofem bypass.  Pt with excellent progress.  Feel pt only needs intermittent assist at home which pt states he will have.  Recommend dc home with HHPT. Has needed equipment.    Follow Up Recommendations  Home health PT    Barriers to Discharge        Equipment Recommendations  None recommended by PT    Recommendations for Other Services    Frequency Min 3X/week   Plan Discharge plan needs to be updated;Frequency remains appropriate    Precautions / Restrictions Precautions Precautions: Fall   Pertinent Vitals/Pain N/A    Mobility  Transfers Sit to Stand: 5: Supervision;With upper extremity assist;From bed Stand to Sit: 6: Modified independent (Device/Increase time);With upper extremity assist;To bed Details for Transfer Assistance: verbal cues for hand placement Ambulation/Gait Ambulation/Gait Assistance: 5: Supervision Ambulation Distance (Feet): 200 Feet Assistive device: Rolling walker Ambulation/Gait Assistance Details: Steady gait Gait Pattern: Step-through pattern;Decreased stride length    Exercises     PT Diagnosis:    PT Problem List:   PT Treatment Interventions:     PT Goals Acute Rehab PT Goals PT Goal: Stand to Sit - Progress: Met Pt will Ambulate: with modified independence;51 - 150 feet;with least restrictive assistive device PT Goal: Ambulate - Progress: Updated due to goal met  Visit Information  Last PT Received On: 10/18/11 Assistance Needed: +1    Subjective Data  Subjective: Pt states he will have some help at home. Patient Stated Goal: Go home   Cognition  Overall Cognitive Status: Appears within functional limits for tasks assessed/performed Arousal/Alertness: Awake/alert Orientation  Level: Appears intact for tasks assessed Behavior During Session: [facility] for tasks performed    Balance  Static Standing Balance Static Standing - Balance Support: No upper extremity supported;During functional activity Static Standing - Level of Assistance: 5: Stand by assistance  End of Session PT - End of Session Activity Tolerance: Patient tolerated treatment well Patient left: in bed;with call bell/phone within reach Nurse Communication: Mobility status   GP     [facility] 10/18/2011, 9:16 AM  [facility] PT [phone]

## 2011-10-18 NOTE — Clinical Social Work Psychosocial (Signed)
Clinical Social Work Department BRIEF PSYCHOSOCIAL ASSESSMENT 10/18/2011  Patient:  [facility]     Account Number:  [id number]     Admit date:  10/10/2011  Clinical Social Worker:  Read Drivers  Date/Time:  10/17/2011 08:47 AM  Referred by:  RN  Date Referred:  10/17/2011 Referred for  SNF Placement   Other Referral:   none   Interview type:  Patient Other interview type:   none    PSYCHOSOCIAL DATA Living Status:  ALONE Admitted from facility:   Level of care:   Primary support name:  April Primary support relationship to patient:  CHILD, ADULT Degree of support available:   fair    CURRENT CONCERNS Current Concerns  Post-Acute Placement   Other Concerns:   none    SOCIAL WORK ASSESSMENT / PLAN CSW assessed pt at bedside while pt was eating lunch.  Pt was alert and oriented.  Pt currently lives at home alone. Pt has limited family/community support.  Pt is refusing SNF.  Pt states that daughter will be able to be with pt 24 hrs per day when d/c from hospital. Pt has no interest in a facility.   Assessment/plan status:  Psychosocial Support/Ongoing Assessment of Needs Other assessment/ plan:   CSW will consult with RNCM   Information/referral to community resources:   SNF    PATIENT'S/FAMILY'S RESPONSE TO PLAN OF CARE: pt was not very responsive to CSW assistance.  Pt did not feel as if my assistance was necessary.        Vickii Penna, LCSWA [phone]  Clinical Social Work

## 2011-10-18 NOTE — Progress Notes (Signed)
CSW assessed pt (please see full assessment).  Pt refuses SNF.  States daughter, April will stay with him.  Her # is [phone].  CSW will f/u with RNCM and assist as necessary. Vickii Penna, LCSWA [phone]  Clinical Social Work

## 2011-10-19 ENCOUNTER — Telehealth: Payer: Self-pay | Admitting: Vascular Surgery

## 2011-10-19 MED ORDER — OXYCODONE-ACETAMINOPHEN 5-325 MG PO TABS: 1.0000 | ORAL_TABLET | ORAL | Status: DC | PRN

## 2011-10-19 NOTE — Progress Notes (Signed)
Occupational Therapy Treatment Patient Details Name: Nathaniel Curtis MRN: [id number] DOB: 07-08-1931 Today's Date: 10/19/2011 Time: 5784-6962 OT Time Calculation (min): 14 min  OT Assessment / Plan / Recommendation Comments on Treatment Session Pt is awaiting d/c.  No DME needs.  Will have intermittent assist for daughter.    Follow Up Recommendations  Home health OT;Supervision - Intermittent    Barriers to Discharge       Equipment Recommendations  None recommended by OT    Recommendations for Other Services    Frequency Min 2X/week   Plan Discharge plan needs to be updated    Precautions / Restrictions Precautions Precautions: Fall Restrictions Weight Bearing Restrictions: No   Pertinent Vitals/Pain     ADL  Grooming: Performed;Modified independent Where Assessed - Grooming: Unsupported standing Lower Body Dressing: Performed;Modified independent Where Assessed - Lower Body Dressing: Unsupported sitting;Supported sit to stand Toilet Transfer: Performed;Supervision/safety Toilet Transfer Method: Sit to Barista: Regular height toilet Toileting - Clothing Manipulation and Hygiene: Performed;Modified independent Equipment Used: Gait belt;Rolling walker Transfers/Ambulation Related to ADLs: modified independent with RW ADL Comments: Pt now able to access feet crossing opposite foot and knee.    OT Diagnosis:    OT Problem List:   OT Treatment Interventions:     OT Goals ADL Goals Pt Will Perform Grooming: with supervision;Standing at sink ADL Goal: Grooming - Progress: Met Pt Will Perform Lower Body Bathing: with supervision;Sit to stand from bed Pt Will Perform Lower Body Dressing: with supervision;Sit to stand from bed ADL Goal: Lower Body Dressing - Progress: Met Pt Will Transfer to Toilet: with supervision;Regular height toilet ADL Goal: Toilet Transfer - Progress: Met Pt Will Perform Toileting - Clothing Manipulation: with  supervision;Standing ADL Goal: Toileting - Clothing Manipulation - Progress: Met Pt Will Perform Toileting - Hygiene: with supervision;Sit to stand from 3-in-1/toilet Miscellaneous OT Goals Miscellaneous OT Goal #1: Pt will request assistance for OOB activity to decrease fall risk. OT Goal: Miscellaneous Goal #1 - Progress: Met  Visit Information  Last OT Received On: 10/19/11 Assistance Needed: +1    Subjective Data      Prior Functioning       Cognition  Overall Cognitive Status: Appears within functional limits for tasks assessed/performed Arousal/Alertness: Awake/alert Orientation Level: Appears intact for tasks assessed Behavior During Session: [facility] for tasks performed    Mobility  Shoulder Instructions Bed Mobility Bed Mobility: Supine to Sit;Sit to Supine Supine to Sit: 6: Modified independent (Device/Increase time) Sit to Supine: 6: Modified independent (Device/Increase time) Transfers Transfers: Sit to Stand Sit to Stand: 6: Modified independent (Device/Increase time);From bed;From toilet Stand to Sit: 6: Modified independent (Device/Increase time);With upper extremity assist;To bed;To chair/3-in-1       Exercises      Balance     End of Session OT - End of Session Activity Tolerance: Patient tolerated treatment well Patient left: in bed;with call bell/phone within reach Nurse Communication: Other (comment) (no equipment needs for home)  GO     Evern Bio 10/19/2011, 9:34 AM [phone]

## 2011-10-19 NOTE — Progress Notes (Signed)
Subjective: Interval History: none.. up walking in room with no distress.wants to go home.  Objective: Vital signs in last 24 hours: Temp:  [98.2 F (36.8 C)-98.5 F (36.9 C)] 98.5 F (36.9 C) (09/26 0610) Pulse Rate:  [84-111] 97  (09/26 0610) Resp:  [18] 18  (09/26 0610) BP: (131-148)/(60-83) 148/83 mmHg (09/26 0610) SpO2:  [96 %-100 %] 96 % (09/26 0610)  Intake/Output from previous day: 09/25 0701 - 09/26 0700 In: 960 [P.O.:960] Out: 400 [Urine:400] Intake/Output this shift:    Abdomen soft nontender feet well-perfused  Lab Results: No results found for this basename: WBC:2,HGB:2,HCT:2,PLT:2 in the last 72 hours BMET  Basename 10/17/11 0500  NA --  K --  CL --  CO2 --  GLUCOSE --  BUN --  CREATININE 0.75  CALCIUM --    Studies/Results: Dg Chest 2 View  10/10/2011  *RADIOLOGY REPORT*  Clinical Data: 78 year old male preoperative study.  Cough shortness of breath. Smoker.  CHEST - 2 VIEW  Comparison: None.  Findings: Lung volumes within normal limits.  Cardiac size and mediastinal contours are within normal limits.  Visualized tracheal air column is within normal limits.  Diffuse increased interstitial markings, slightly more confluent about the left hilum and right infrahilar region.  Mild apical scarring.  No pneumothorax, edema, effusion or consolidation.  Mild lower thoracic compression deformity appears chronic.  IMPRESSION: Probable chronic lung changes, no definite acute cardiopulmonary abnormality.   Original Report Authenticated By: Harley Hallmark, M.D.    Dg Ankle 2 Views Right  10/10/2011  *RADIOLOGY REPORT*  Clinical Data: 78 year old male with pain.  Cellulitis of the foot. Redness extending up the right ankle.  RIGHT ANKLE - 2 VIEW  Comparison: Right foot series 06/22/2010.  Findings: Calcaneus intact.  Bone mineralization about the right ankle is within normal limits.  No evidence of ankle joint effusion.  Talar dome intact.  No distal tibia or fibula  fracture.  IMPRESSION: No acute osseous abnormality identified about the right ankle.   Original Report Authenticated By: Harley Hallmark, M.D.    Ct Angio Ao+bifem W/cm &/or Wo/cm  09/26/2011  *RADIOLOGY REPORT*  Clinical Data:  Iliac stenosis  CT ANGIOGRAPHY OF ABDOMINAL AORTA WITH ILIOFEMORAL RUNOFF  Technique:  Multidetector CT imaging of the abdomen, pelvis and lower extremities was performed using the standard protocol during bolus administration of intravenous contrast.  Multiplanar CT image reconstructions including MIPs were obtained to evaluate the vascular anatomy.  Contrast: OMNIPAQUE IOHEXOL 350 MG/ML SOLN  Comparison:  08/25/2010  Findings:  Motion artifact degrades the study.  Extensive plaque in the descending thoracic aorta without significant focal narrowing.  No significant change.  Plaque in the upper abdominal aorta, at the diaphragmatic hiatus without significant narrowing.  Juxtarenal plaque in the aorta is noted.  This has slightly progressed.  There is plaque in the infrarenal aorta with severe narrowing which has progressed.  There is now near occlusion of the infrarenal aorta. Prominence of the right inferior epigastric artery supports pre occlusion in the aorta.  This vessel has become more prominent.  Moderate narrowing at the origin of the celiac is stable.  SMA is patent.  Bilateral single renal arteries are grossly patent with mild atherosclerotic change. Severe narrowing at the origin of the IMA is stable.  Right common iliac artery remains occluded.  Right external iliac artery and internal iliac artery reconstitute and are severely diminutive with diffuse atherosclerotic change. Moderate long segment narrowing of the distal right external iliac artery.  Severe narrowing in the proximal left common iliac artery is stable.  Left internal and external iliac arteries are severely diminutive with diffuse atherosclerotic change but no significant focal stenosis.  Right common  femoral artery and superficial femoral artery are diminutive and patent with atherosclerotic changes.  There are more prominent atherosclerotic changes in the distal right superficial femoral artery.  Right popliteal artery is patent.  Two-vessel runoff to the right ankle via the peroneal and posterior tibial arteries.  Left common femoral artery is patent.  Profunda femoral arteries patent.  Moderate plaque at the origin of the profunda and superficial femoral arteries.  The superficial femoral artery is diminutive and patent with mild atherosclerotic changes.  There is a significant near occlusive stenosis of the left popliteal artery above the knee joint.  This has progressed since the prior study. One vessel runoff to the left ankle via the peroneal artery.  Focal atelectasis verses airspace disease at the posterior basal segment of the left lower lobe.  Liver, gallbladder, spleen, pancreas are within normal limits. Prominence of the adrenal glands without focal mass is stable.  Sub centimeter short axis diameter epigastric and gastrohepatic ligament nodes.  No abnormal adenopathy by measurement criteria.  Normal appendix.  No focal bowel wall thickening or evidence of bowel obstruction.  Negative free fluid.  Prominent prostate stable.  Severe L5 S1 degenerative disc disease.  Mild anterolisthesis L4 upon L5.  No pars defect.  Severe facet arthropathy at L4-5 and L5- S1.  Foraminal stenosis at these levels is present.  It is severe on the right at L4-5.   Review of the MIP images confirms the above findings.  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  It is nearly occluded.  There is irregular plaque throughout the visualized aorta.  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  Diffuse narrowing of the distal right external iliac artery is not significantly changed.  Significant stenosis in the proximal left common iliac artery is stable.  Right lower  extremity arterial structures are diffusely diminutive with two-vessel runoff.  Significant stenosis in the left popliteal artery above the knee joint has progressed with one vessel runoff to the left ankle.   Original Report Authenticated By: Donavan Burnet, M.D.    Nm Myocar Multi W/spect W/wall Motion / Ef  09/28/2011  The Lexiscan study was done under the supervision of the [facility] cardiology staff.  There were no EKG changes of ischemia noted.  The quality of the images is fair. There is some motion artefact on the stress images.  Perfusion images show small mid-inferior wall scar without significant peri-infarct ischemia.  The EDV is 66 ml.  The ESV is 27 ml.  The LV EF is 59%. There is mild basilar inferior wall hypokinesis.  Impression: Old inferior wall scar without significant peri-infarct ischemia.  Thomas A. Brackbill MD   Original Report Authenticated By: Johnsie Cancel Chest Portable 1 View  10/14/2011  *RADIOLOGY REPORT*  Clinical Data: Postop  PORTABLE CHEST - 1 VIEW  Comparison: 10/13/2011; 10/10/2011  Findings:  Grossly unchanged enlarged cardiac silhouette and mediastinal contours. Minimal retraction of right jugular approach PICC catheter with tip now overlying the expected location of the right main pulmonary artery outflow tract.  Worsening right mid and left retrocardiac opacity.  Cephalization of flow without frank evidence of edema.  No definite pleural effusion or pneumothorax.  Unchanged bones.  IMPRESSION: 1.  Interval retraction of the  PA catheter with tip now overlying the right main pulmonary artery outflow tract. 2.  Worsening right mid and left retrocardiac opacities, likely atelectasis. 3.  Pulmonary venous congestion without frank evidence of edema.   Original Report Authenticated By: Waynard Reeds, M.D.    Dg Chest Portable 1 View  10/13/2011  *RADIOLOGY REPORT*  Clinical Data: Postop aorto-bifemoral bypass grafting.  Evaluate support apparatus.  PORTABLE CHEST - 1 VIEW   Comparison: Two-view chest x-ray 10/10/2011.  Findings: Right jugular Swan-Ganz catheter tip projects over the right interlobar pulmonary artery.  Right arm PICC tip projects over the lower SVC.  Nasogastric tube courses below the diaphragm into the stomach.  Cardiac silhouette upper normal in size to slightly enlarged but stable.  Mild pulmonary venous hypertension without overt edema.  Mild atelectasis in the lung bases related to suboptimal inspiration.  Lungs otherwise clear.  IMPRESSION:  1.  Swan-Ganz catheter tip projects over the right intralobar pulmonary artery.  This could be withdrawn approximately 3 cm to have the tip in the right main pulmonary artery. 2.  Remaining support apparatus satisfactory. 3.  Suboptimal inspiration accounts for bibasilar atelectasis.  No acute cardiopulmonary disease otherwise.   Original Report Authenticated By: Arnell Sieving, M.D.    Dg Foot 2 Views Right  10/10/2011  *RADIOLOGY REPORT*  Clinical Data: 78 year old male with cellulitis pain and redness.  RIGHT FOOT - 2 VIEW  Comparison: Right foot series 06/22/2010.  Findings: Stable varus angulation at the fifth MTP joint.  Bone mineralization throughout the right foot appears stable since 2012. No osteolysis.  No fracture or dislocation identified.  Joint spaces preserved.  No subcutaneous gas. No radiopaque foreign body identified.  IMPRESSION: Stable radiographic appearance of the right foot since 2012.   Original Report Authenticated By: Harley Hallmark, M.D.    Anti-infectives: Anti-infectives     Start     Dose/Rate Route Frequency Ordered Stop   10/13/11 1700   cefUROXime (ZINACEF) 1.5 g in dextrose 5 % 50 mL IVPB        1.5 g 100 mL/hr over 30 Minutes Intravenous Every 12 hours 10/13/11 1613 10/14/11 0530   10/13/11 0500   ceFAZolin (ANCEF) IVPB 1 g/50 mL premix     Comments: Send with pt to OR      1 g 100 mL/hr over 30 Minutes Intravenous On call 10/12/11 0744 10/13/11 0507   10/11/11 0600    cefUROXime (ZINACEF) 1.5 g in dextrose 5 % 50 mL IVPB        1.5 g 100 mL/hr over 30 Minutes Intravenous 30 min pre-op 10/10/11 1840 10/13/11 0626   10/10/11 2200   piperacillin-tazobactam (ZOSYN) IVPB 3.375 g  Status:  Discontinued        3.375 g 12.5 mL/hr over 240 Minutes Intravenous 3 times per day 10/10/11 1812 10/13/11 1538   10/10/11 1800   vancomycin (VANCOCIN) 1,250 mg in sodium chloride 0.9 % 250 mL IVPB  Status:  Discontinued        1,250 mg 166.7 mL/hr over 90 Minutes Intravenous Every 24 hours 10/10/11 1727 10/18/11 1329          Assessment/Plan: s/p Procedure(s) (LRB) with comments: AORTA BIFEMORAL BYPASS GRAFT (N/A) - Aortic bifemoral bypass with 18mm x 9mm graft.   Doing well status post aorta bifemoral bypass. Plan discharge home today followup in 2-3 weeks   LOS: 9 days   Parrish Bonn 10/19/2011, 7:49 AM

## 2011-10-19 NOTE — Discharge Summary (Signed)
[facility] Discharge Summary   Patient ID:  Nathaniel Curtis MRN: [id number] DOB/AGE: [birth year] 78 y.o.  Admit date: 10/10/2011 Discharge date: 10/19/2011 Date of Surgery: 10/10/2011 - 10/13/2011 Surgeon: Moishe Spice): Larina Earthly, MD  Admission Diagnosis: Cellulitis of right foot [682.7] vassular issues AIOD  Discharge Diagnoses:  Cellulitis of right foot [682.7] vassular issues AIOD  Secondary Diagnoses: Past Medical History  Diagnosis Date  . Hyperlipidemia   . CHF (congestive heart failure)     Echo 09/26/11 - EF 50-55%, grade 1 diastolic dysfunction, no RWMAs, no significant valvular abnls  . Heart attack 2009    found on EKG, no cath  . Arthritis   . Glaucoma   . Tobacco abuse     1ppd for 70+yrs  . PVD (peripheral vascular disease)     severe iliac disease  . ETOH abuse     70+ yrs, 3 glasses of brandy/night     Procedure(s): AORTA BIFEMORAL BYPASS GRAFT  Discharged Condition: good  HPI: CC: Pain and cellulitis in right foot in pt with know aorto-iliac occlusive disease  History of Present Illness: Nathaniel Curtis is a 78 y.o. male  With HX of CAD, smoker and ETOH abuse with Known PVD - aorto-iliac occlusive disease. Pt was scheduled for Aorto bifemoral bypass on Fri 10/13/11. Pt has had cardiac clearance. Pt had recently been D/C from [facility] on 09/29/11. He states on Saturday he began to have constant pain and increasing redness in the right foot. He did not think he could get a doctor on the weekend and called our office today to see Dr. Arbie Cookey- however EMT brought him to ER. He states the left foot is the same.  He states he drinks 3 drinks of hard liquor daily and smokes 1 PPD. He states he goes "cold [place]" off both ETOH and cigarettes.     Hospital Course:  [name] is a 78 y.o. male is S/P Bilateral Procedure(s): AORTA BIFEMORAL BYPASS GRAFT Extubated: POD # 0 Post-op wounds clean, dry, intact or healing well Pt. Ambulating, voiding and  taking PO diet without difficulty. Pt pain controlled with PO pain meds. Labs as below Complications:none  Consults:     Significant Diagnostic Studies: CBC Lab Results  Component Value Date   WBC 11.6* 10/16/2011   HGB 9.4* 10/16/2011   HCT 26.4* 10/16/2011   MCV 88.9 10/16/2011   PLT 154 10/16/2011    BMET    Component Value Date/Time   NA 138 10/16/2011 0520   K 3.9 10/16/2011 0520   CL 104 10/16/2011 0520   CO2 26 10/16/2011 0520   GLUCOSE 80 10/16/2011 0520   BUN 13 10/16/2011 0520   CREATININE 0.75 10/17/2011 0500   CALCIUM 8.6 10/16/2011 0520   GFRNONAA 86* 10/17/2011 0500   GFRAA >90 10/17/2011 0500   COAG Lab Results  Component Value Date   INR 1.41 10/13/2011   INR 1.10 10/10/2011     Disposition:  Discharge to :Home   [place] [place]  Home Medication Instructions QIO:[id number]   Printed on:10/19/11 0753  Medication Information                    nitroGLYCERIN (NITROSTAT) 0.4 MG SL tablet Place 0.4 mg under the tongue every 5 (five) minutes as needed. As needed for chest pain.           aspirin 81 MG EC tablet Take 81 mg by mouth daily.  simvastatin (ZOCOR) 20 MG tablet Take 20 mg by mouth at bedtime.             spironolactone (ALDACTONE) 25 MG tablet Take 25 mg by mouth at bedtime.            lisinopril (PRINIVIL,ZESTRIL) 2.5 MG tablet Take 2.5 mg by mouth daily.             bimatoprost (LUMIGAN) 0.03 % ophthalmic solution Place 1 drop into the right eye at bedtime.             gabapentin (NEURONTIN) 600 MG tablet Take 600 mg by mouth 3 (three) times daily.           niacin (NIASPAN) 500 MG CR tablet Take 1,000 mg by mouth at bedtime.           clopidogrel (PLAVIX) 75 MG tablet Take 75 mg by mouth daily.           metoprolol succinate (TOPROL-XL) 25 MG 24 hr tablet Take 25 mg by mouth daily.           doxycycline (VIBRA-TABS) 100 MG tablet Take 100 mg by mouth 2 (two) times daily. Finished medication 10/09/11            Verbal and  written Discharge instructions given to the patient. Wound care per Discharge AVS F/U in 2-3 weeks with Dr. Arbie Cookey  Signed: Thomasena Edis, Sebert Stollings [facility] 10/19/2011, 7:53 AM

## 2011-10-19 NOTE — Telephone Encounter (Signed)
Message copied by Margaretmary Eddy on Thu Oct 19, 2011 10:09 AM ------      Message from: Melene Plan      Created: Thu Oct 19, 2011  9:34 AM                   ----- Message -----         From: Lars Mage, PA         Sent: 10/19/2011   7:57 AM           To: Melene Plan, RN            F/U with Dr. Arbie Cookey in 2-3 weeks aorta bi-fem thanks mc

## 2011-11-07 ENCOUNTER — Ambulatory Visit: Payer: Medicare Other | Admitting: Vascular Surgery

## 2011-11-14 ENCOUNTER — Ambulatory Visit: Payer: Medicare Other | Admitting: Vascular Surgery

## 2011-11-20 ENCOUNTER — Encounter: Payer: Self-pay | Admitting: Vascular Surgery

## 2011-11-21 ENCOUNTER — Ambulatory Visit: Payer: Medicare Other | Admitting: Vascular Surgery

## 2011-11-22 ENCOUNTER — Telehealth: Payer: Self-pay | Admitting: Vascular Surgery

## 2011-11-22 NOTE — Telephone Encounter (Addendum)
Message copied by Rosalyn Charters on Wed Nov 22, 2011  4:29 PM ------      Message from: Lamar Blinks S      Created: Wed Nov 22, 2011  4:10 PM      Regarding: f/u appt.      Contact: [phone]       Pls. Call pt. Regarding rescheduling his post op appt.  Couldn't get here yesterday due to transportation issues.  Can we get him in to see TFE next Tues?  NOTIFIED PATIENT OF FU APPT. WITH DR. EARLY ON 11-28-11 AT 2:45

## 2011-11-27 ENCOUNTER — Encounter: Payer: Self-pay | Admitting: Vascular Surgery

## 2011-11-28 ENCOUNTER — Encounter: Payer: Self-pay | Admitting: Vascular Surgery

## 2011-11-28 ENCOUNTER — Ambulatory Visit (INDEPENDENT_AMBULATORY_CARE_PROVIDER_SITE_OTHER): Payer: Medicare Other | Admitting: Vascular Surgery

## 2011-11-28 ENCOUNTER — Other Ambulatory Visit: Payer: Self-pay | Admitting: *Deleted

## 2011-11-28 VITALS — BP 118/66 | HR 94 | Temp 98.0°F | Ht 67.0 in | Wt 139.0 lb

## 2011-11-28 DIAGNOSIS — L98499 Non-pressure chronic ulcer of skin of other sites with unspecified severity: Secondary | ICD-10-CM

## 2011-11-28 DIAGNOSIS — I739 Peripheral vascular disease, unspecified: Secondary | ICD-10-CM | POA: Insufficient documentation

## 2011-11-28 MED ORDER — TRAMADOL HCL 50 MG PO TABS: 50.0000 mg | ORAL_TABLET | Freq: Four times a day (QID) | ORAL | Status: DC | PRN

## 2011-11-28 NOTE — Progress Notes (Signed)
Patient has today for followup of his aortobifemoral bypass for critical limb ischemia. Surgery with 10/19/2011. He today for followup. Looks quite good. He is resolved soreness from his abdominal and groin incisions. He does have a normal pulses in his groins bilaterally. His feet are well perfused. Does have a dry eschar on the pad of his left great toe and a small ulceration the dorsum of his foot on the right. These all appear to be healing and he is appearing on the auto amputate the pad of his left great toe. He will continue his usual activities. He was given prescription for Ultram for pain. We will see him again in 6 weeks for continued followup. He will continue to have a mouth nurse followup of his wounds

## 2011-12-14 ENCOUNTER — Telehealth: Payer: Self-pay

## 2011-12-14 DIAGNOSIS — I739 Peripheral vascular disease, unspecified: Secondary | ICD-10-CM

## 2011-12-14 DIAGNOSIS — M79676 Pain in unspecified toe(s): Secondary | ICD-10-CM

## 2011-12-14 MED ORDER — TRAMADOL HCL 50 MG PO TABS: 50.0000 mg | ORAL_TABLET | Freq: Four times a day (QID) | ORAL | Status: DC | PRN

## 2011-12-14 NOTE — Telephone Encounter (Signed)
Phone call from pt. With request for refill of Tramadol.  States has continued pain in left great toe and left foot.  Discussed w/ Dr. Arbie Cookey.  Rec'd v.o. For Tramadol 50 mg., 1 tab q 6 hr/ prn for pain.  Notified pt. That will send order to his pharmacy for RF of Tramadol.  Verb. Understanding.

## 2011-12-29 ENCOUNTER — Other Ambulatory Visit: Payer: Self-pay

## 2011-12-29 DIAGNOSIS — I739 Peripheral vascular disease, unspecified: Secondary | ICD-10-CM

## 2011-12-29 DIAGNOSIS — M79676 Pain in unspecified toe(s): Secondary | ICD-10-CM

## 2011-12-29 MED ORDER — TRAMADOL HCL 50 MG PO TABS: 50.0000 mg | ORAL_TABLET | Freq: Four times a day (QID) | ORAL | Status: DC | PRN

## 2012-01-08 ENCOUNTER — Telehealth: Payer: Self-pay | Admitting: Vascular Surgery

## 2012-01-08 ENCOUNTER — Encounter: Payer: Self-pay | Admitting: Vascular Surgery

## 2012-01-08 NOTE — Telephone Encounter (Addendum)
Message copied by Shari Prows on Mon Jan 08, 2012  2:13 PM ------      Message from: Marcellus Scott      Created: Mon Jan 08, 2012  1:56 PM       Eva just called to cancel his appointment for tomorrow. He will call us back to reschedule this appointment probably around the first of the year.            Thanks      Revonda Standard  I cancelled the appt for the above pt per [facility]'s staff message. awt

## 2012-01-09 ENCOUNTER — Ambulatory Visit: Payer: Medicare Other | Admitting: Vascular Surgery

## 2015-09-15 ENCOUNTER — Encounter (HOSPITAL_COMMUNITY): Payer: Self-pay | Admitting: Internal Medicine

## 2015-09-15 ENCOUNTER — Inpatient Hospital Stay (HOSPITAL_COMMUNITY)
Admission: AD | Admit: 2015-09-15 | Discharge: 2015-09-20 | DRG: 254 | Disposition: A | Discharge status: Home / Self Care | Payer: Medicare HMO | Source: Other Acute Inpatient Hospital | Attending: Internal Medicine | Admitting: Internal Medicine

## 2015-09-15 DIAGNOSIS — L97519 Non-pressure chronic ulcer of other part of right foot with unspecified severity: Secondary | ICD-10-CM

## 2015-09-15 DIAGNOSIS — I70223 Atherosclerosis of native arteries of extremities with rest pain, bilateral legs: Secondary | ICD-10-CM | POA: Diagnosis not present

## 2015-09-15 DIAGNOSIS — L97529 Non-pressure chronic ulcer of other part of left foot with unspecified severity: Secondary | ICD-10-CM | POA: Diagnosis present

## 2015-09-15 DIAGNOSIS — I252 Old myocardial infarction: Secondary | ICD-10-CM

## 2015-09-15 DIAGNOSIS — Z01810 Encounter for preprocedural cardiovascular examination: Secondary | ICD-10-CM | POA: Diagnosis not present

## 2015-09-15 DIAGNOSIS — J449 Chronic obstructive pulmonary disease, unspecified: Secondary | ICD-10-CM | POA: Diagnosis present

## 2015-09-15 DIAGNOSIS — I5022 Chronic systolic (congestive) heart failure: Secondary | ICD-10-CM | POA: Diagnosis not present

## 2015-09-15 DIAGNOSIS — Z681 Body mass index (BMI) 19 or less, adult: Secondary | ICD-10-CM | POA: Diagnosis not present

## 2015-09-15 DIAGNOSIS — E785 Hyperlipidemia, unspecified: Secondary | ICD-10-CM | POA: Diagnosis present

## 2015-09-15 DIAGNOSIS — I998 Other disorder of circulatory system: Secondary | ICD-10-CM | POA: Diagnosis not present

## 2015-09-15 DIAGNOSIS — Z961 Presence of intraocular lens: Secondary | ICD-10-CM | POA: Diagnosis present

## 2015-09-15 DIAGNOSIS — Z888 Allergy status to other drugs, medicaments and biological substances status: Secondary | ICD-10-CM

## 2015-09-15 DIAGNOSIS — K5900 Constipation, unspecified: Secondary | ICD-10-CM | POA: Diagnosis not present

## 2015-09-15 DIAGNOSIS — I739 Peripheral vascular disease, unspecified: Secondary | ICD-10-CM | POA: Diagnosis present

## 2015-09-15 DIAGNOSIS — Z79899 Other long term (current) drug therapy: Secondary | ICD-10-CM

## 2015-09-15 DIAGNOSIS — Z9889 Other specified postprocedural states: Secondary | ICD-10-CM | POA: Diagnosis not present

## 2015-09-15 DIAGNOSIS — Z8249 Family history of ischemic heart disease and other diseases of the circulatory system: Secondary | ICD-10-CM | POA: Diagnosis not present

## 2015-09-15 DIAGNOSIS — I2510 Atherosclerotic heart disease of native coronary artery without angina pectoris: Secondary | ICD-10-CM | POA: Diagnosis present

## 2015-09-15 DIAGNOSIS — H409 Unspecified glaucoma: Secondary | ICD-10-CM | POA: Diagnosis present

## 2015-09-15 DIAGNOSIS — I509 Heart failure, unspecified: Secondary | ICD-10-CM

## 2015-09-15 DIAGNOSIS — I5042 Chronic combined systolic (congestive) and diastolic (congestive) heart failure: Secondary | ICD-10-CM | POA: Diagnosis present

## 2015-09-15 DIAGNOSIS — L97509 Non-pressure chronic ulcer of other part of unspecified foot with unspecified severity: Secondary | ICD-10-CM | POA: Diagnosis present

## 2015-09-15 DIAGNOSIS — M069 Rheumatoid arthritis, unspecified: Secondary | ICD-10-CM | POA: Diagnosis present

## 2015-09-15 DIAGNOSIS — D62 Acute posthemorrhagic anemia: Secondary | ICD-10-CM | POA: Diagnosis not present

## 2015-09-15 DIAGNOSIS — F17210 Nicotine dependence, cigarettes, uncomplicated: Secondary | ICD-10-CM | POA: Diagnosis present

## 2015-09-15 MED ORDER — ACETAMINOPHEN 325 MG PO TABS: 650.0000 mg | ORAL_TABLET | Freq: Four times a day (QID) | ORAL | Status: DC | PRN

## 2015-09-15 MED ORDER — ONDANSETRON HCL 4 MG/2ML IJ SOLN: 4.0000 mg | Freq: Four times a day (QID) | INTRAMUSCULAR | Status: DC | PRN

## 2015-09-15 MED ORDER — OXYCODONE HCL 5 MG PO TABS
5.0000 mg | ORAL_TABLET | ORAL | Status: DC | PRN
  Administered 2015-09-16 – 2015-09-18 (×7): 5 mg via ORAL
  Filled 2015-09-15 (×7): qty 1

## 2015-09-15 MED ORDER — MORPHINE SULFATE (PF) 2 MG/ML IV SOLN
2.0000 mg | INTRAVENOUS | Status: DC | PRN
  Administered 2015-09-15 – 2015-09-19 (×17): 2 mg via INTRAVENOUS
  Filled 2015-09-15 (×19): qty 1

## 2015-09-15 MED ORDER — ONDANSETRON HCL 4 MG PO TABS: 4.0000 mg | ORAL_TABLET | Freq: Four times a day (QID) | ORAL | Status: DC | PRN

## 2015-09-15 MED ORDER — DOXYCYCLINE HYCLATE 100 MG IV SOLR
100.0000 mg | Freq: Two times a day (BID) | INTRAVENOUS | Status: DC
  Administered 2015-09-16 – 2015-09-19 (×7): 100 mg via INTRAVENOUS
  Filled 2015-09-15 (×9): qty 100

## 2015-09-15 MED ORDER — ACETAMINOPHEN 650 MG RE SUPP
650.0000 mg | Freq: Four times a day (QID) | RECTAL | Status: DC | PRN
Start: 2015-09-15 — End: 2015-09-19

## 2015-09-15 NOTE — H&P (Signed)
History and Physical    Nathaniel Curtis PQZ:[id number] DOB: [birth year] DOA: 09/15/2015  PCP: Wilmer Floor., MD  Patient coming from: [facility].  Chief Complaint: Lower extremity pain and nonhealing ulcers.  HPI: Nathaniel Curtis is a 82 y.o. male with history of diastolic CHF last EF measured was 55-60% in 2013, peripheral vascular disease status post femoral bypass in 2013 by Dr. Arbie Cookey had presented to the ER at [facility] with nonhealing ulcers of his lower extremity on his foot and toes with worsening pain. CT angiographically done shows near occlusion of left SFA and right SFA with proximal popliteal reconstitution. Since patient has worsening pain and nonhealing ulcers and patient is known to Dr. Arbie Cookey patient is being transferred to [facility] for further management. On exam patient has multiple ulcers on the left foot distal toes and also on the right dorsum of his foot. There is mild discharge. X-rays done at [facility] did not show any definite osteomyelitis.   ED Course: Was a direct admit.  Review of Systems: As per HPI, rest all negative.   Past Medical History:  Diagnosis Date  . Arthritis   . CHF (congestive heart failure) (HCC)    Echo 09/26/11 - EF 50-55%, grade 1 diastolic dysfunction, no RWMAs, no significant valvular abnls  . ETOH abuse    70+ yrs, 3 glasses of brandy/night   . Glaucoma   . Heart attack (HCC) 2009   found on EKG, no cath  . Hyperlipidemia   . PVD (peripheral vascular disease) (HCC)    severe iliac disease  . Tobacco abuse    1ppd for 70+yrs    Past Surgical History:  Procedure Laterality Date  . AORTA - BILATERAL FEMORAL ARTERY BYPASS GRAFT  10/13/2011   Procedure: AORTA BIFEMORAL BYPASS GRAFT;  Surgeon: Larina Earthly, MD;  Location: [facility] OR;  Service: Vascular;  Laterality: N/A;  Aortic bifemoral bypass with 20mm x 42mm graft.    Marland Kitchen CARDIAC CATHETERIZATION  10/2007  . CATARACT EXTRACTION W/ INTRAOCULAR LENS IMPLANT  RIGHT EYE  2009  . EYE SURGERY  02/09     reports that he has been smoking Cigarettes.  He has a 70.00 pack-year smoking history. He has quit using smokeless tobacco. He reports that he drinks alcohol. He reports that he does not use drugs.  Allergies  Allergen Reactions  . Lyrica [Pregabalin] Swelling    Swelling in feet and ankles.    Family History  Problem Relation Age of Onset  . Heart disease Maternal Grandfather     MI 88yo    Prior to Admission medications   Medication Sig Start Date End Date Taking? Authorizing Provider  Aspirin-Salicylamide-Caffeine (BC HEADACHE PO) Take 1 packet by mouth daily as needed. For headache   Yes Historical Provider, MD  nitroGLYCERIN (NITROSTAT) 0.4 MG SL tablet Place 0.4 mg under the tongue every 5 (five) minutes as needed. As needed for chest pain.    Historical Provider, MD  oxyCODONE-acetaminophen (PERCOCET/ROXICET) 5-325 MG per tablet Take 1-2 tablets by mouth every 4 (four) hours as needed. Patient not taking: Reported on 09/15/2015 10/19/11   Lars Mage, PA-C  traMADol (ULTRAM) 50 MG tablet Take 1 tablet (50 mg total) by mouth every 6 (six) hours as needed for pain. Patient not taking: Reported on 09/15/2015 12/29/11   Larina Earthly, MD    Physical Exam: Vitals:   09/15/15 2127  BP: 113/65  Pulse: 76  Resp: 18  Temp: 98.6 F (37  C)  SpO2: 96%  Weight: 118 lb (53.5 kg)      Constitutional: Not in distress. Vitals:   09/15/15 2127  BP: 113/65  Pulse: 76  Resp: 18  Temp: 98.6 F (37 C)  SpO2: 96%  Weight: 118 lb (53.5 kg)   Eyes: Anicteric no pallor. ENMT: No discharge from the ears eyes nose and mouth. Neck: No mass felt. No JVD appreciated. Respiratory: No rhonchi or crepitations. Cardiovascular: S1 and S2 heard. Abdomen: Soft nontender bowel sounds present. Musculoskeletal: Has multiple ulcers on the left foot toe and dorsum of the right foot with poor pulses. Skin: See musculoskeletal system. Neurologic: Alert awake  oriented to time place and person. Moves all extremities. Psychiatric: Appears normal.   Labs on Admission: I have personally reviewed following labs and imaging studies  CBC: No results for input(s): WBC, NEUTROABS, HGB, HCT, MCV, PLT in the last 168 hours. Basic Metabolic Panel: No results for input(s): NA, K, CL, CO2, GLUCOSE, BUN, CREATININE, CALCIUM, MG, PHOS in the last 168 hours. GFR: CrCl cannot be calculated (Unknown ideal weight.). Liver Function Tests: No results for input(s): AST, ALT, ALKPHOS, BILITOT, PROT, ALBUMIN in the last 168 hours. No results for input(s): LIPASE, AMYLASE in the last 168 hours. No results for input(s): AMMONIA in the last 168 hours. Coagulation Profile: No results for input(s): INR, PROTIME in the last 168 hours. Cardiac Enzymes: No results for input(s): CKTOTAL, CKMB, CKMBINDEX, TROPONINI in the last 168 hours. BNP (last 3 results) No results for input(s): PROBNP in the last 8760 hours. HbA1C: No results for input(s): HGBA1C in the last 72 hours. CBG: No results for input(s): GLUCAP in the last 168 hours. Lipid Profile: No results for input(s): CHOL, HDL, LDLCALC, TRIG, CHOLHDL, LDLDIRECT in the last 72 hours. Thyroid Function Tests: No results for input(s): TSH, T4TOTAL, FREET4, T3FREE, THYROIDAB in the last 72 hours. Anemia Panel: No results for input(s): VITAMINB12, FOLATE, FERRITIN, TIBC, IRON, RETICCTPCT in the last 72 hours. Urine analysis:    Component Value Date/Time   COLORURINE YELLOW 10/10/2011 2301   APPEARANCEUR CLEAR 10/10/2011 2301   LABSPEC 1.015 10/10/2011 2301   PHURINE 6.5 10/10/2011 2301   GLUCOSEU NEGATIVE 10/10/2011 2301   HGBUR NEGATIVE 10/10/2011 2301   BILIRUBINUR NEGATIVE 10/10/2011 2301   KETONESUR NEGATIVE 10/10/2011 2301   PROTEINUR NEGATIVE 10/10/2011 2301   UROBILINOGEN 0.2 10/10/2011 2301   NITRITE NEGATIVE 10/10/2011 2301   LEUKOCYTESUR NEGATIVE 10/10/2011 2301   Sepsis  Labs: @LABRCNTIP (procalcitonin:4,lacticidven:4) )No results found for this or any previous visit (from the past 240 hour(s)).   Radiological Exams on Admission: No results found.   Assessment/Plan Principal Problem:   Skin ulcers of foot, bilateral (HCC) Active Problems:   PVD (peripheral vascular disease) with claudication (HCC)   CHF, chronic (HCC)   Foot ulcer (HCC)    1. Nonhealing ulcers of the both feet with peripheral vascular disease - at this time patient is being continued on pain relief medications and place patient on doxycycline. Please notify Dr. , vascular surgeon in a.m. 2. History of diastolic CHF last EF measured in 2013 was 55-60% with grade 2 diastolic dysfunction - presently appears euvolemic and not on any medications. Closely observe. 3. Elevated creatinine of around 1.6 - labs done at [facility] showing a creatinine of 1.6. Repeat labs at this time has been ordered. Closely follow. Check UA. 4. Tobacco abuse - tobacco cessation counseling. 5. History of CAD as per the chart. Has not had any intervention. Denies  any chest pain. 6. History of alcohol abuse has not had any alcohol for last 2 years.  Repeat labs including CBC metabolic panel and sedimentation rate are pending.   DVT prophylaxis: Lovenox. Code Status: Full code.  Family Communication: Discussed with patient.  Disposition Plan: Home.  Consults called: Vascular surgery is to be notified in a.m.  Admission status: Inpatient. MedSurg.    Eduard Clos MD [facility] Pager [phone].  If 7PM-7AM, please contact night-coverage [email or web address] Password [facility]  09/15/2015, 11:44 PM

## 2015-09-15 NOTE — Progress Notes (Signed)
New Admission Note:  Pt admitted to 6E01 from [facility]   Arrival Method: Via [facility] Mental Orientation: Alert and Oriented x 4 Telemetry: Awaiting Orders Assessment: Completed Skin: Bilateral venous stasis ulcers IV: R posterior FA Pain: 5 to bilateral feet Tubes: None Safety Measures: Safety Fall Prevention Plan has been discussed  Admission: To be completed 6 [place] Orientation: Patient has been orientated to the room, unit and staff.  Family: None at bedside   Page to [facility] admissions for notification of patients arrival to the unit. Will await admitting MD and continue to monitor the patient. Call light has been placed within reach and bed alarm has been activated.   Burley Saver, BSN, RN-BC Phone: [phone]

## 2015-09-16 ENCOUNTER — Inpatient Hospital Stay (HOSPITAL_COMMUNITY): Payer: Medicare HMO

## 2015-09-16 ENCOUNTER — Other Ambulatory Visit: Payer: Self-pay

## 2015-09-16 DIAGNOSIS — I5022 Chronic systolic (congestive) heart failure: Secondary | ICD-10-CM

## 2015-09-16 DIAGNOSIS — Z01810 Encounter for preprocedural cardiovascular examination: Secondary | ICD-10-CM

## 2015-09-16 DIAGNOSIS — I509 Heart failure, unspecified: Secondary | ICD-10-CM

## 2015-09-16 DIAGNOSIS — I998 Other disorder of circulatory system: Secondary | ICD-10-CM

## 2015-09-16 DIAGNOSIS — I70223 Atherosclerosis of native arteries of extremities with rest pain, bilateral legs: Secondary | ICD-10-CM

## 2015-09-16 DIAGNOSIS — L97529 Non-pressure chronic ulcer of other part of left foot with unspecified severity: Secondary | ICD-10-CM

## 2015-09-16 DIAGNOSIS — L97519 Non-pressure chronic ulcer of other part of right foot with unspecified severity: Secondary | ICD-10-CM

## 2015-09-16 DIAGNOSIS — I739 Peripheral vascular disease, unspecified: Secondary | ICD-10-CM

## 2015-09-16 LAB — CBC WITH DIFFERENTIAL/PLATELET
BASOS PCT: 1 %
Basophils Absolute: 0.1 10*3/uL (ref 0.0–0.1)
EOS ABS: 0.8 10*3/uL — AB (ref 0.0–0.7)
Eosinophils Relative: 10 %
HEMATOCRIT: 31.5 % — AB (ref 39.0–52.0)
HEMOGLOBIN: 10.3 g/dL — AB (ref 13.0–17.0)
LYMPHS ABS: 1.5 10*3/uL (ref 0.7–4.0)
Lymphocytes Relative: 18 %
MCH: 30.1 pg (ref 26.0–34.0)
MCHC: 32.7 g/dL (ref 30.0–36.0)
MCV: 92.1 fL (ref 78.0–100.0)
MONO ABS: 0.7 10*3/uL (ref 0.1–1.0)
MONOS PCT: 8 %
Neutro Abs: 5.6 10*3/uL (ref 1.7–7.7)
Neutrophils Relative %: 64 %
Platelets: 398 10*3/uL (ref 150–400)
RBC: 3.42 MIL/uL — ABNORMAL LOW (ref 4.22–5.81)
RDW: 16.6 % — AB (ref 11.5–15.5)
WBC: 8.7 10*3/uL (ref 4.0–10.5)

## 2015-09-16 LAB — URINALYSIS, ROUTINE W REFLEX MICROSCOPIC
Bilirubin Urine: NEGATIVE
Glucose, UA: NEGATIVE mg/dL
Hgb urine dipstick: NEGATIVE
Ketones, ur: NEGATIVE mg/dL
Leukocytes, UA: NEGATIVE
NITRITE: NEGATIVE
PH: 7 (ref 5.0–8.0)
Protein, ur: NEGATIVE mg/dL
SPECIFIC GRAVITY, URINE: 1.01 (ref 1.005–1.030)

## 2015-09-16 LAB — ECHOCARDIOGRAM COMPLETE: WEIGHTICAEL: 1888 [oz_av]

## 2015-09-16 LAB — COMPREHENSIVE METABOLIC PANEL
ALBUMIN: 2.5 g/dL — AB (ref 3.5–5.0)
ALK PHOS: 70 U/L (ref 38–126)
ALT: 12 U/L — AB (ref 17–63)
AST: 19 U/L (ref 15–41)
Anion gap: 6 (ref 5–15)
BUN: 13 mg/dL (ref 6–20)
CALCIUM: 8.4 mg/dL — AB (ref 8.9–10.3)
CHLORIDE: 103 mmol/L (ref 101–111)
CO2: 25 mmol/L (ref 22–32)
CREATININE: 1.01 mg/dL (ref 0.61–1.24)
GFR calc Af Amer: >60 mL/min (ref >60)
GFR calc non Af Amer: >60 mL/min (ref >60)
GLUCOSE: 92 mg/dL (ref 65–99)
Potassium: 4.4 mmol/L (ref 3.5–5.1)
Sodium: 134 mmol/L — ABNORMAL LOW (ref 135–145)
Total Bilirubin: 0.4 mg/dL (ref 0.3–1.2)
Total Protein: 6 g/dL — ABNORMAL LOW (ref 6.5–8.1)

## 2015-09-16 LAB — TYPE AND SCREEN
ABO/RH(D): B POS
Antibody Screen: NEGATIVE

## 2015-09-16 LAB — SEDIMENTATION RATE: SED RATE: 94 mm/h — AB (ref 0–16)

## 2015-09-16 MED ORDER — DEXTROSE 5 % IV SOLN
1.5000 g | INTRAVENOUS | Status: AC
  Administered 2015-09-17: 1.5 g via INTRAVENOUS
  Filled 2015-09-16: qty 1.5

## 2015-09-16 MED ORDER — METOPROLOL TARTRATE 5 MG/5ML IV SOLN: 5.0000 mg | INTRAVENOUS | Status: DC | PRN

## 2015-09-16 MED ORDER — ASPIRIN 81 MG PO CHEW
81.0000 mg | CHEWABLE_TABLET | Freq: Every day | ORAL | Status: DC
  Administered 2015-09-16 – 2015-09-20 (×4): 81 mg via ORAL
  Filled 2015-09-16 (×4): qty 1

## 2015-09-16 MED ORDER — ALBUTEROL SULFATE (2.5 MG/3ML) 0.083% IN NEBU
2.5000 mg | INHALATION_SOLUTION | RESPIRATORY_TRACT | Status: DC | PRN
Start: 2015-09-16 — End: 2015-09-20

## 2015-09-16 MED ORDER — ATORVASTATIN CALCIUM 20 MG PO TABS
20.0000 mg | ORAL_TABLET | Freq: Every day | ORAL | Status: DC
  Administered 2015-09-16 – 2015-09-19 (×4): 20 mg via ORAL
  Filled 2015-09-16 (×5): qty 1

## 2015-09-16 MED ORDER — HEPARIN SODIUM (PORCINE) 5000 UNIT/ML IJ SOLN
5000.0000 [IU] | Freq: Three times a day (TID) | INTRAMUSCULAR | Status: DC
  Administered 2015-09-16 – 2015-09-20 (×12): 5000 [IU] via SUBCUTANEOUS
  Filled 2015-09-16 (×12): qty 1

## 2015-09-16 NOTE — Consult Note (Signed)
CONSULT NOTE  Date: 09/16/2015               Patient Name:  Nathaniel Curtis MRN: [id number]  DOB: 1931-02-13 Age / Sex: 82 y.o., male        PCP: CAMPBELL, STEPHEN D. Primary Cardiologist: New/ Nahser            Referring Physician: Burney Gauze              Reason for Consult: preop eval prior to vascular surgery            History of Present Illness: Patient is a 82 y.o. male with a PMHx of PVD, COPD, who was admitted to [facility] on 09/15/2015 for evaluation of bilateral leg ischemia .   The patient has a history of peripheral vascular surgery. He has been seen by Dr. Arbie Cookey in the past. Also family has had some surgery up in [place] in 2015.  The patient does not go to the doctor on any regular basis. His had multiple vascular surgeries and has had lower limb ischemia. He's had ulcerations on both legs for the past several months.  He denies any chest pain. He denies PND orthopnea. He smokes heavily.. Because of his leg ischemia does not get any exercise. He does not work.  He is able to stand for about 10 minutes maximum. He is not able walk more than 30 or 40 feet without pain.  Medications: Outpatient medications: Prescriptions Prior to Admission  Medication Sig Dispense Refill Last Dose  . Aspirin-Salicylamide-Caffeine (BC HEADACHE PO) Take 1 packet by mouth daily as needed. For headache   Past Week at Unknown time  . nitroGLYCERIN (NITROSTAT) 0.4 MG SL tablet Place 0.4 mg under the tongue every 5 (five) minutes as needed. As needed for chest pain.   unknown at Unknown time  . oxyCODONE-acetaminophen (PERCOCET/ROXICET) 5-325 MG per tablet Take 1-2 tablets by mouth every 4 (four) hours as needed. (Patient not taking: Reported on 09/15/2015) 30 tablet 0 Not Taking at Unknown time  . traMADol (ULTRAM) 50 MG tablet Take 1 tablet (50 mg total) by mouth every 6 (six) hours as needed for pain. (Patient not taking: Reported on 09/15/2015) 30 tablet 0 Not Taking at Unknown time      Current medications: Current Facility-Administered Medications  Medication Dose Route Frequency Provider Last Rate Last Dose  . acetaminophen (TYLENOL) tablet 650 mg  650 mg Oral Q6H PRN Eduard Clos, MD       Or  . acetaminophen (TYLENOL) suppository 650 mg  650 mg Rectal Q6H PRN Eduard Clos, MD      . albuterol (PROVENTIL) (2.5 MG/3ML) 0.083% nebulizer solution 2.5 mg  2.5 mg Nebulization Q4H PRN Leroy Sea, MD      . aspirin chewable tablet 81 mg  81 mg Oral Daily Leroy Sea, MD   81 mg at 09/16/15 0948  . atorvastatin (LIPITOR) tablet 20 mg  20 mg Oral q1800 Leroy Sea, MD      . Melene Muller ON 09/17/2015] cefUROXime (ZINACEF) 1.5 g in dextrose 5 % 50 mL IVPB  1.5 g Intravenous To SS-Surg Lars Mage, PA-C      . doxycycline (VIBRAMYCIN) 100 mg in dextrose 5 % 250 mL IVPB  100 mg Intravenous BID Eduard Clos, MD   100 mg at 09/16/15 1006  . heparin injection 5,000 Units  5,000 Units Subcutaneous Q8H Leroy Sea, MD   5,000  Units at 09/16/15 0949  . metoprolol (LOPRESSOR) injection 5 mg  5 mg Intravenous Q4H PRN Leroy Sea, MD      . morphine 2 MG/ML injection 2 mg  2 mg Intravenous Q2H PRN Eduard Clos, MD   2 mg at 09/16/15 1003  . ondansetron (ZOFRAN) tablet 4 mg  4 mg Oral Q6H PRN Eduard Clos, MD       Or  . ondansetron [facility]) injection 4 mg  4 mg Intravenous Q6H PRN Eduard Clos, MD      . oxyCODONE (Oxy IR/ROXICODONE) immediate release tablet 5 mg  5 mg Oral Q4H PRN Eduard Clos, MD   5 mg at 09/16/15 1003     Allergies  Allergen Reactions  . Lyrica [Pregabalin] Swelling    Swelling in feet and ankles.     Past Medical History:  Diagnosis Date  . Arthritis   . CHF (congestive heart failure) (HCC)    Echo 09/26/11 - EF 50-55%, grade 1 diastolic dysfunction, no RWMAs, no significant valvular abnls  . ETOH abuse    70+ yrs, 3 glasses of brandy/night   . Glaucoma   . Heart attack (HCC) 2009    found on EKG, no cath  . Hyperlipidemia   . PVD (peripheral vascular disease) (HCC)    severe iliac disease  . Tobacco abuse    1ppd for 70+yrs    Past Surgical History:  Procedure Laterality Date  . AORTA - BILATERAL FEMORAL ARTERY BYPASS GRAFT  10/13/2011   Procedure: AORTA BIFEMORAL BYPASS GRAFT;  Surgeon: Larina Earthly, MD;  Location: [facility] OR;  Service: Vascular;  Laterality: N/A;  Aortic bifemoral bypass with 55mm x 76mm graft.    Marland Kitchen CARDIAC CATHETERIZATION  10/2007  . CATARACT EXTRACTION W/ INTRAOCULAR LENS IMPLANT  RIGHT EYE 2009  . EYE SURGERY  02/09    Family History  Problem Relation Age of Onset  . Heart disease Maternal Grandfather     MI 75yo    Social History:  reports that he has been smoking Cigarettes.  He has a 70.00 pack-year smoking history. He has quit using smokeless tobacco. He reports that he drinks alcohol. He reports that he does not use drugs.   Review of Systems: Constitutional:  denies fever, chills, diaphoresis, appetite change and fatigue.  HEENT: denies photophobia, eye pain, redness, hearing loss, ear pain, congestion, sore throat, rhinorrhea, sneezing, neck pain, neck stiffness and tinnitus.  Respiratory: admits to SOB, DOE, cough, chest tightness, and wheezing.  Cardiovascular: denies chest pain, palpitations and leg swelling.  Gastrointestinal: denies nausea, vomiting, abdominal pain, diarrhea, constipation, blood in stool.  Genitourinary: denies dysuria, urgency, frequency, hematuria, flank pain and difficulty urinating.  Musculoskeletal: denies  myalgias, back pain, joint swelling, arthralgias and gait problem.   Skin: denies pallor, rash and wound.  Neurological: denies dizziness, seizures, syncope, weakness, light-headedness, numbness and headaches.   Hematological: denies adenopathy, easy bruising, personal or family bleeding history.  Psychiatric/ Behavioral: denies suicidal ideation, mood changes, confusion, nervousness, sleep disturbance  and agitation.    Physical Exam: BP (!) 120/49 (BP Location: Left Arm)   Pulse 73   Temp 98 F (36.7 C) (Oral)   Resp 18   Wt 118 lb (53.5 kg)   SpO2 99%   BMI 18.48 kg/m   Wt Readings from Last 3 Encounters:  09/15/15 118 lb (53.5 kg)  11/28/11 139 lb (63 kg)  10/15/11 158 lb 11.7 oz (72 kg)    General:  Vital signs reviewed and noted.  An elderly disheveled gentleman. He appears to be chronically ill.   Head: Normocephalic, atraumatic, sclera anicteric,    Neck: Supple. Negative for carotid bruits.  He has a full beard. It is very difficult to examine his neck.   Lungs:  Few rhonchi,  rediced breath sounds   Heart: RRR with S1 S2. No murmurs, rubs, or gallops   Abdomen/ GI :  Soft, non-tender, non-distended with normoactive bowel sounds. No hepatomegaly. No rebound/guarding. No obvious abdominal masses   MSK: Very poor distal pulses.   He has several ulcerations on both feet    Ext:   + evidence of poor arterial circulation   Neurologic:  CN are grossly intact,  No obvious motor or sensory defect.  Alert and oriented X 3. Moves all extremities spontaneously.  Psych: Responds to questions appropriately with a normal affect.     Lab results: Basic Metabolic Panel:  Recent Labs Lab 09/16/15 0020  NA 134*  K 4.4  CL 103  CO2 25  GLUCOSE 92  BUN 13  CREATININE 1.01  CALCIUM 8.4*    Liver Function Tests:  Recent Labs Lab 09/16/15 0020  AST 19  ALT 12*  ALKPHOS 70  BILITOT 0.4  PROT 6.0*  ALBUMIN 2.5*   No results for input(s): LIPASE, AMYLASE in the last 168 hours. No results for input(s): AMMONIA in the last 168 hours.  CBC:  Recent Labs Lab 09/16/15 0020  WBC 8.7  NEUTROABS 5.6  HGB 10.3*  HCT 31.5*  MCV 92.1  PLT 398    Cardiac Enzymes: No results for input(s): CKTOTAL, CKMB, CKMBINDEX, TROPONINI in the last 168 hours.  BNP: Invalid input(s): POCBNP  CBG: No results for input(s): GLUCAP in the last 168 hours.  Coagulation Studies: No  results for input(s): LABPROT, INR in the last 72 hours.   Other results: Personal review of EKG shows :  -   ECG to be done   Imaging:  No results found.     Assessment & Plan:  1. Preop Evaluation prior to vascular surgery :  Pt has No symptoms of angina. He has chronic shortness of breath due to his heavy cigarette smoking and history of COPD. His last echocardiogram performed here revealed normal left ventricle systolic function but he informed me that an echocardiogram performed up in [place] in 2015 revealed an ejection fraction of 45%. He has no symptoms of congestive heart failure.  We will repeat his echocardiogram for further assessment of his LV function. He has vascular surgery planned for tomorrow. This surgery is required for limb salvage. If his left ventricle systolic motion remains stable, I think that we can safely said his at low to moderate risk from a cardiac vascular standpoint.  He is at risk for having respiratory difficulties.  Vesta Mixer, Montez Hageman., MD, [facility] 09/16/2015, 11:47 AM Office - [phone] Pager [phone]

## 2015-09-16 NOTE — Consult Note (Signed)
Patient name: Nathaniel Curtis MRN: [id number] DOB: [birth year] Sex: male   Referred by: [facility]  Reason for referral lateral lower extremity critical limb ischemia  HISTORY OF PRESENT ILLNESS: 82 year old gentleman who presented to [facility] several days ago with progressive tissue loss on both lower extremities. He reports this is been present for approximately 6 weeks. He has been having severe pain in both lower femoris for in excess of 4 months. He has not been having a great amount of medical care. He denies any fevers or any suggestion of sepsis related to his lower extremity tissue loss. He does have near constant pain associated with this bilaterally. Reports that the left side is slightly worse than the right. He underwent CT angiogram that [facility] showing infrainguinal occlusive disease and was transferred to [facility] for further evaluation and treatment. The patient is well-known to me from a prior aortobifemoral bypass in 2013. At that time he had presented with critical limb ischemia and gangrenous changes on both feet. He did well following his aortofemoral bypass eventually healing all the tissue loss on his feet and has done well for 5 years. CT scan revealed that the aortobifemoral bypass was widely patent and that his occlusive disease is now in for inguinal. Does have a history of myocardial infarction in 2009. Echocardiogram in 2013 the time of the surgery revealed a well-preserved ejection fraction at 50-55%. He denies any chest pain or other cardiac events and has no suggestion of congestive failure. Does have COPD and ongoing cigarette abuse  Past Medical History:  Diagnosis Date  . Arthritis   . CHF (congestive heart failure) (HCC)    Echo 09/26/11 - EF 50-55%, grade 1 diastolic dysfunction, no RWMAs, no significant valvular abnls  . ETOH abuse    70+ yrs, 3 glasses of brandy/night   . Glaucoma   . Heart attack (HCC) 2009   found on EKG, no cath  .  Hyperlipidemia   . PVD (peripheral vascular disease) (HCC)    severe iliac disease  . Tobacco abuse    1ppd for 70+yrs    Past Surgical History:  Procedure Laterality Date  . AORTA - BILATERAL FEMORAL ARTERY BYPASS GRAFT  10/13/2011   Procedure: AORTA BIFEMORAL BYPASS GRAFT;  Surgeon: Larina Earthly, MD;  Location: [facility] OR;  Service: Vascular;  Laterality: N/A;  Aortic bifemoral bypass with 16mm x 79mm graft.    Marland Kitchen CARDIAC CATHETERIZATION  10/2007  . CATARACT EXTRACTION W/ INTRAOCULAR LENS IMPLANT  RIGHT EYE 2009  . EYE SURGERY  02/09    Social History   Social History  . Marital status: Widowed    Spouse name: N/A  . Number of children: N/A  . Years of education: N/A   Occupational History  . Not on file.   Social History Main Topics  . Smoking status: Current Every Day Smoker    Packs/day: 1.00    Years: 70.00    Types: Cigarettes  . Smokeless tobacco: Former Neurosurgeon  . Alcohol use Yes     Comment: 70+ years, 3 glasses of brandy a night  . Drug use: No  . Sexual activity: Not on file   Other Topics Concern  . Not on file   Social History Narrative  . No narrative on file    Family History  Problem Relation Age of Onset  . Heart disease Maternal Grandfather     MI 3yo    Allergies as of 09/15/2015 - Review  Complete 09/15/2015  Allergen Reaction Noted  . Lyrica [pregabalin] Swelling 08/31/2010    No current facility-administered medications on file prior to encounter.    Current Outpatient Prescriptions on File Prior to Encounter  Medication Sig Dispense Refill  . nitroGLYCERIN (NITROSTAT) 0.4 MG SL tablet Place 0.4 mg under the tongue every 5 (five) minutes as needed. As needed for chest pain.    Marland Kitchen oxyCODONE-acetaminophen (PERCOCET/ROXICET) 5-325 MG per tablet Take 1-2 tablets by mouth every 4 (four) hours as needed. (Patient not taking: Reported on 09/15/2015) 30 tablet 0  . traMADol (ULTRAM) 50 MG tablet Take 1 tablet (50 mg total) by mouth every 6 (six) hours  as needed for pain. (Patient not taking: Reported on 09/15/2015) 30 tablet 0     REVIEW OF SYSTEMS:  Positives indicated with an "X"  CARDIOVASCULAR:  [ ]  chest pain   [ ]  chest pressure   [ ]  palpitations   [ ]  orthopnea   [ ]  dyspnea on exertion   [ ]  claudication   [ ]  rest pain   [ ]  DVT   [ ]  phlebitis PULMONARY:   [ ]  productive cough   [ ]  asthma   [ ]  wheezing NEUROLOGIC:   [ ]  weakness  [ ]  paresthesias  [ ]  aphasia  [ ]  amaurosis  [ ]  dizziness HEMATOLOGIC:   [ ]  bleeding problems   [ ]  clotting disorders MUSCULOSKELETAL:  [x ] joint pain   [ ]  joint swelling GASTROINTESTINAL: [ ]   blood in stool  [ ]   hematemesis GENITOURINARY:  [ ]   dysuria  [ ]   hematuria PSYCHIATRIC:  [ ]  history of major depression INTEGUMENTARY:  [ x] rashes  [x ] ulcers CONSTITUTIONAL:  [ ]  fever   [ ]  chills  PHYSICAL EXAMINATION:  General: The patient is a well-nourished male, in no acute distress. Vital signs are BP (!) 129/59 (BP Location: Left Arm)   Pulse 69   Temp 99.2 F (37.3 C) (Oral)   Resp 18   Wt 118 lb (53.5 kg)   SpO2 91%   BMI 18.48 kg/m  Pulmonary: There is a good air exchange bilaterally without wheezing or rales. Abdomen: Soft and non-tender with normal pitch bowel sounds. Musculoskeletal: Severe changes of rheumatoid arthritis in both hands. Neurologic: No focal weakness or paresthesias are detected, Skin: Superficial ulceration over the dorsum of his left foot extending on his toes. S extensive superficial ulceration on his right dorsum and less extension onto the toes Psychiatric: The patient has normal affect. Cardiovascular: There is a regular rate and rhythm without significant murmur appreciated. 2+ radial and 2+ femoral pulses. Absent popliteal and distal pulses   CT angiogram from [facility] shows wide patency of his aortofemoral bypass. He has complete occlusion at the origin of the superficial femoral arteries bilaterally with reconstitution of the  popliteal artery above the knee but with disease at the knee. He does have better flow in the below-knee popliteal artery with good tibial runoff.  Impression and Plan:  Bilateral critical limb ischemia left greater than right with severe rest pain and tissue loss. His left foot pain is worse and his tissue loss is greater on the left. Would recommend left femoral to popliteal bypass for limb salvage. Would then follow-up with right femoral-popliteal bypass hopefully in several weeks if his right leg remained stable during this time. Has been stable from a cardiac standpoint since his last major operation in 2013. Have asked cardiology for assistance  perioperatively as well.    Gretta Began [facility] Office: [phone]

## 2015-09-16 NOTE — Progress Notes (Signed)
Echocardiogram 2D Echocardiogram has been performed.  Nathaniel Curtis 09/16/2015, 12:55 PM

## 2015-09-16 NOTE — Progress Notes (Signed)
PROGRESS NOTE                                                                                                                                                                                                             Patient Demographics:    Nathaniel Curtis, is a 82 y.o. male, DOB - [birth year], WKG:[id number]  Admit date - 09/15/2015   Admitting Physician Ozella Rocks, MD  Outpatient Primary MD for the patient is CAMPBELL, Mila Homer., MD  LOS - 1  No chief complaint on file.      Brief Narrative     Nathaniel Curtis is a 82 y.o. male with history of diastolic CHF last EF measured was 55-60% in 2013, peripheral vascular disease status post femoral bypass in 2013 by Dr. Arbie Cookey had presented to the ER at [facility] with nonhealing ulcers of his lower extremity on his foot and toes with worsening pain. CT angiographically done shows near occlusion of left SFA and right SFA with proximal popliteal reconstitution. Since patient has worsening pain and nonhealing ulcers and patient is known to Dr. Arbie Cookey patient is being transferred to [facility] for further management. On exam patient has multiple ulcers on the left foot distal toes and also on the right dorsum of his foot. There is mild discharge. X-rays done at [facility] did not show any definite osteomyelitis.   ED Course: Was a direct admit.   Subjective:    Nathaniel Curtis today has, No headache, No chest pain, No abdominal pain - No Nausea, No new weakness tingling or numbness, No Cough - SOB. Does have pain in both legs left more than right   Assessment  & Plan :     1.Bilateral lower extremity pain with bilateral foot ulcers due to bilateral SFA PAD. Left much severe than right. Dr. Arbie Cookey vascular surgeon on board, currently no signs of sepsis or severe infection, continue doxycycline, placed on aspirin and Lipitor, do for possible left femoropopliteal bypass on  09/17/2015. We'll have cardiology evaluate preop.   2. History of CAD and chronic diastolic CHF EF 55%. Currently appears compensated. Cardiology to evaluate as above. Currently chest pain-free, placed on aspirin and statin for secondary prevention. Adds needed IV Lopressor for blood pressure.  3. History of smoking. Counseled to quit, says he quit alcohol several years ago.  4. Questionable ARF at [facility]. Creatinine normal here. We will monitor.    Family Communication  : None present  Code Status :  Full  Diet : Heart Healthy  Disposition Plan  :  The inpatient  Consults  : Cardiology, vascular surgery  Procedures  :     DVT Prophylaxis  :    Heparin   Lab Results  Component Value Date   PLT 398 09/16/2015    Inpatient Medications  Scheduled Meds: . [START ON 09/17/2015] cefUROXime (ZINACEF)  IV  1.5 g Intravenous To SS-Surg  . doxycycline (VIBRAMYCIN) IV  100 mg Intravenous BID   Continuous Infusions:  PRN Meds:.acetaminophen **OR** acetaminophen, morphine injection, ondansetron **OR** ondansetron (ZOFRAN) IV, oxyCODONE  Antibiotics  :    Anti-infectives    Start     Dose/Rate Route Frequency Ordered Stop   09/17/15 0600  cefUROXime (ZINACEF) 1.5 g in dextrose 5 % 50 mL IVPB     1.5 g 100 mL/hr over 30 Minutes Intravenous To ShortStay Surgical 09/16/15 0810 09/18/15 0600   09/16/15 0030  doxycycline (VIBRAMYCIN) 100 mg in dextrose 5 % 250 mL IVPB     100 mg 125 mL/hr over 120 Minutes Intravenous 2 times daily 09/15/15 2343           Objective:   Vitals:   09/15/15 2127 09/16/15 0504  BP: 113/65 (!) 129/59  Pulse: 76 69  Resp: 18 18  Temp: 98.6 F (37 C) 99.2 F (37.3 C)  TempSrc:  Oral  SpO2: 96% 91%  Weight: 53.5 kg (118 lb)     Wt Readings from Last 3 Encounters:  09/15/15 53.5 kg (118 lb)  11/28/11 63 kg (139 lb)  10/15/11 72 kg (158 lb 11.7 oz)     Intake/Output Summary (Last 24 hours) at 09/16/15 0930 Last data filed at 09/16/15  0240  Gross per 24 hour  Intake              250 ml  Output                0 ml  Net              250 ml     Physical Exam  Awake Alert, Oriented X 3, No new F.N deficits, Normal affect [place].AT,PERRAL Supple Neck,No JVD, No cervical lymphadenopathy appriciated.  Symmetrical Chest wall movement, Good air movement bilaterally, CTAB RRR,No Gallops,Rubs or new Murmurs, No Parasternal Heave +ve B.Sounds, Abd Soft, No tenderness, No organomegaly appriciated, No rebound - guarding or rigidity. No Cyanosis, Clubbing or edema, No new Rash or bruise  Both lower extremities have signs of chronic ischemia, bilateral lower extremity digits show signs of ischemia with left leg having ulcer on the dorsal aspect of the foot which is irregular in shape and about 3 cm in diameter    Data Review:    CBC  Recent Labs Lab 09/16/15 0020  WBC 8.7  HGB 10.3*  HCT 31.5*  PLT 398  MCV 92.1  MCH 30.1  MCHC 32.7  RDW 16.6*  LYMPHSABS 1.5  MONOABS 0.7  EOSABS 0.8*  BASOSABS 0.1    Chemistries   Recent Labs Lab 09/16/15 0020  NA 134*  K 4.4  CL 103  CO2 25  GLUCOSE 92  BUN 13  CREATININE 1.01  CALCIUM 8.4*  AST 19  ALT 12*  ALKPHOS 70  BILITOT 0.4   ------------------------------------------------------------------------------------------------------------------ No results for input(s): CHOL, HDL, LDLCALC, TRIG, CHOLHDL, LDLDIRECT in the last  72 hours.  No results found for: HGBA1C ------------------------------------------------------------------------------------------------------------------ No results for input(s): TSH, T4TOTAL, T3FREE, THYROIDAB in the last 72 hours.  Invalid input(s): FREET3 ------------------------------------------------------------------------------------------------------------------ No results for input(s): VITAMINB12, FOLATE, FERRITIN, TIBC, IRON, RETICCTPCT in the last 72 hours.  Coagulation profile No results for input(s): INR, PROTIME in the  last 168 hours.  No results for input(s): DDIMER in the last 72 hours.  Cardiac Enzymes No results for input(s): CKMB, TROPONINI, MYOGLOBIN in the last 168 hours.  Invalid input(s): CK ------------------------------------------------------------------------------------------------------------------ No results found for: BNP  Micro Results No results found for this or any previous visit (from the past 240 hour(s)).  Radiology Reports No results found.  Time Spent in minutes  30   SINGH,PRASHANT K M.D on 09/16/2015 at 9:30 AM  Between 7am to 7pm - Pager - [phone]  After 7pm go to [email or web address] - password [facility]  [facility] -  Office  [phone]

## 2015-09-16 NOTE — Progress Notes (Signed)
VASCULAR LAB PRELIMINARY  ARTERIAL  ABI completed:Right ABI indicates severe reduction of arterial blood flow at rest.  Left ABI indicates moderate to severe redcution of arterial blood flow at rest.     RIGHT    LEFT    PRESSURE WAVEFORM  PRESSURE WAVEFORM  BRACHIAL restricted T BRACHIAL 133 T  DP   DP    AT 60 M AT 85 M  PT 64 DM PT 57 DM  PER   PER    GREAT TOE  NA GREAT TOE  NA    RIGHT LEFT  ABI 0.48 0.64     Savanah Bayles, RVT 09/16/2015, 4:46 PM

## 2015-09-17 ENCOUNTER — Encounter (HOSPITAL_COMMUNITY)
Admission: AD | Disposition: A | Discharge status: Home / Self Care | Payer: Self-pay | Source: Other Acute Inpatient Hospital | Attending: Internal Medicine

## 2015-09-17 ENCOUNTER — Inpatient Hospital Stay: Admit: 2015-09-17 | Payer: Medicare HMO | Admitting: Vascular Surgery

## 2015-09-17 ENCOUNTER — Inpatient Hospital Stay (HOSPITAL_COMMUNITY): Payer: Medicare HMO | Admitting: Certified Registered Nurse Anesthetist

## 2015-09-17 ENCOUNTER — Encounter (HOSPITAL_COMMUNITY): Payer: Self-pay | Admitting: Certified Registered Nurse Anesthetist

## 2015-09-17 HISTORY — PX: FEMORAL-POPLITEAL BYPASS GRAFT: SHX937

## 2015-09-17 HISTORY — PX: VEIN HARVEST: SHX6363

## 2015-09-17 LAB — BASIC METABOLIC PANEL
ANION GAP: 8 (ref 5–15)
BUN: 10 mg/dL (ref 6–20)
CALCIUM: 8.6 mg/dL — AB (ref 8.9–10.3)
CO2: 26 mmol/L (ref 22–32)
Chloride: 98 mmol/L — ABNORMAL LOW (ref 101–111)
Creatinine, Ser: 1.03 mg/dL (ref 0.61–1.24)
GLUCOSE: 81 mg/dL (ref 65–99)
Potassium: 4.2 mmol/L (ref 3.5–5.1)
Sodium: 132 mmol/L — ABNORMAL LOW (ref 135–145)

## 2015-09-17 LAB — CBC
HCT: 30.7 % — ABNORMAL LOW (ref 39.0–52.0)
HEMOGLOBIN: 10.1 g/dL — AB (ref 13.0–17.0)
MCH: 30.1 pg (ref 26.0–34.0)
MCHC: 32.9 g/dL (ref 30.0–36.0)
MCV: 91.6 fL (ref 78.0–100.0)
Platelets: 395 10*3/uL (ref 150–400)
RBC: 3.35 MIL/uL — ABNORMAL LOW (ref 4.22–5.81)
RDW: 16.2 % — ABNORMAL HIGH (ref 11.5–15.5)
WBC: 9.5 10*3/uL (ref 4.0–10.5)

## 2015-09-17 LAB — PROTIME-INR
INR: 1.1
PROTHROMBIN TIME: 14.2 s (ref 11.4–15.2)

## 2015-09-17 SURGERY — BYPASS GRAFT FEMORAL-POPLITEAL ARTERY
Anesthesia: General | Site: Leg Lower | Laterality: Left

## 2015-09-17 MED ORDER — PROTAMINE SULFATE 10 MG/ML IV SOLN
INTRAVENOUS | Status: AC
  Filled 2015-09-17: qty 5

## 2015-09-17 MED ORDER — PROPOFOL 10 MG/ML IV BOLUS
INTRAVENOUS | Status: DC | PRN
  Administered 2015-09-17: 120 mg via INTRAVENOUS

## 2015-09-17 MED ORDER — ONDANSETRON HCL 4 MG/2ML IJ SOLN
INTRAMUSCULAR | Status: AC
  Filled 2015-09-17: qty 2

## 2015-09-17 MED ORDER — LIDOCAINE HCL (CARDIAC) 20 MG/ML IV SOLN
INTRAVENOUS | Status: DC | PRN
  Administered 2015-09-17: 100 mg via INTRAVENOUS

## 2015-09-17 MED ORDER — PHENYLEPHRINE HCL 10 MG/ML IJ SOLN
INTRAMUSCULAR | Status: DC | PRN
  Administered 2015-09-17 (×2): 80 ug via INTRAVENOUS

## 2015-09-17 MED ORDER — OXYCODONE-ACETAMINOPHEN 5-325 MG PO TABS
1.0000 | ORAL_TABLET | ORAL | Status: DC | PRN
  Administered 2015-09-18 – 2015-09-20 (×6): 2 via ORAL
  Filled 2015-09-17 (×6): qty 2

## 2015-09-17 MED ORDER — ONDANSETRON HCL 4 MG/2ML IJ SOLN
INTRAMUSCULAR | Status: DC | PRN
  Administered 2015-09-17: 4 mg via INTRAVENOUS

## 2015-09-17 MED ORDER — DOCUSATE SODIUM 100 MG PO CAPS: 100.0000 mg | ORAL_CAPSULE | Freq: Every day | ORAL | Status: DC

## 2015-09-17 MED ORDER — PROPOFOL 10 MG/ML IV BOLUS
INTRAVENOUS | Status: AC
  Filled 2015-09-17: qty 20

## 2015-09-17 MED ORDER — SUGAMMADEX SODIUM 200 MG/2ML IV SOLN
INTRAVENOUS | Status: DC | PRN
  Administered 2015-09-17: 100 mg via INTRAVENOUS

## 2015-09-17 MED ORDER — FENTANYL CITRATE (PF) 100 MCG/2ML IJ SOLN
INTRAMUSCULAR | Status: AC
  Filled 2015-09-17: qty 2

## 2015-09-17 MED ORDER — LIDOCAINE 2% (20 MG/ML) 5 ML SYRINGE
INTRAMUSCULAR | Status: AC
  Filled 2015-09-17: qty 5

## 2015-09-17 MED ORDER — GUAIFENESIN-DM 100-10 MG/5ML PO SYRP: 15.0000 mL | ORAL_SOLUTION | ORAL | Status: DC | PRN

## 2015-09-17 MED ORDER — MAGNESIUM SULFATE 2 GM/50ML IV SOLN
2.0000 g | Freq: Every day | INTRAVENOUS | Status: DC | PRN
  Filled 2015-09-17: qty 50

## 2015-09-17 MED ORDER — ONDANSETRON HCL 4 MG/2ML IJ SOLN: 4.0000 mg | Freq: Once | INTRAMUSCULAR | Status: DC | PRN

## 2015-09-17 MED ORDER — SUGAMMADEX SODIUM 200 MG/2ML IV SOLN
INTRAVENOUS | Status: AC
  Filled 2015-09-17: qty 2

## 2015-09-17 MED ORDER — PANTOPRAZOLE SODIUM 40 MG PO TBEC
40.0000 mg | DELAYED_RELEASE_TABLET | Freq: Every day | ORAL | Status: DC
  Administered 2015-09-17 – 2015-09-20 (×4): 40 mg via ORAL
  Filled 2015-09-17 (×4): qty 1

## 2015-09-17 MED ORDER — HEPARIN SODIUM (PORCINE) 1000 UNIT/ML IJ SOLN
INTRAMUSCULAR | Status: DC | PRN
  Administered 2015-09-17: 5000 [IU] via INTRAVENOUS

## 2015-09-17 MED ORDER — HYDROMORPHONE HCL 1 MG/ML IJ SOLN
INTRAMUSCULAR | Status: AC
  Filled 2015-09-17: qty 1

## 2015-09-17 MED ORDER — ALUM & MAG HYDROXIDE-SIMETH 200-200-20 MG/5ML PO SUSP: 15.0000 mL | ORAL | Status: DC | PRN

## 2015-09-17 MED ORDER — ROCURONIUM BROMIDE 100 MG/10ML IV SOLN
INTRAVENOUS | Status: DC | PRN
  Administered 2015-09-17: 50 mg via INTRAVENOUS

## 2015-09-17 MED ORDER — SODIUM CHLORIDE 0.9 % IV SOLN: 500.0000 mL | Freq: Once | INTRAVENOUS | Status: DC | PRN

## 2015-09-17 MED ORDER — ROCURONIUM BROMIDE 10 MG/ML (PF) SYRINGE
PREFILLED_SYRINGE | INTRAVENOUS | Status: AC
  Filled 2015-09-17: qty 10

## 2015-09-17 MED ORDER — HYDRALAZINE HCL 20 MG/ML IJ SOLN: 5.0000 mg | INTRAMUSCULAR | Status: DC | PRN

## 2015-09-17 MED ORDER — SODIUM CHLORIDE 0.9 % IV SOLN: INTRAVENOUS | Status: DC

## 2015-09-17 MED ORDER — HEPARIN SODIUM (PORCINE) 1000 UNIT/ML IJ SOLN
INTRAMUSCULAR | Status: AC
  Filled 2015-09-17: qty 1

## 2015-09-17 MED ORDER — PHENOL 1.4 % MT LIQD: 1.0000 | OROMUCOSAL | Status: DC | PRN

## 2015-09-17 MED ORDER — PHENYLEPHRINE HCL 10 MG/ML IJ SOLN
INTRAVENOUS | Status: DC | PRN
  Administered 2015-09-17: 20 ug/min via INTRAVENOUS

## 2015-09-17 MED ORDER — HYDROMORPHONE HCL 1 MG/ML IJ SOLN
0.2500 mg | INTRAMUSCULAR | Status: DC | PRN
  Administered 2015-09-17: 0.25 mg via INTRAVENOUS

## 2015-09-17 MED ORDER — 0.9 % SODIUM CHLORIDE (POUR BTL) OPTIME
TOPICAL | Status: DC | PRN
  Administered 2015-09-17: 2000 mL

## 2015-09-17 MED ORDER — POTASSIUM CHLORIDE CRYS ER 20 MEQ PO TBCR: 20.0000 meq | EXTENDED_RELEASE_TABLET | Freq: Every day | ORAL | Status: DC | PRN

## 2015-09-17 MED ORDER — LACTATED RINGERS IV SOLN
INTRAVENOUS | Status: DC | PRN
  Administered 2015-09-17 (×2): via INTRAVENOUS

## 2015-09-17 MED ORDER — DEXTROSE 5 % IV SOLN
1.5000 g | Freq: Two times a day (BID) | INTRAVENOUS | Status: AC
  Administered 2015-09-17 – 2015-09-18 (×2): 1.5 g via INTRAVENOUS
  Filled 2015-09-17 (×2): qty 1.5

## 2015-09-17 MED ORDER — SODIUM CHLORIDE 0.9 % IV SOLN
INTRAVENOUS | Status: DC
  Administered 2015-09-17: 13:00:00 via INTRAVENOUS

## 2015-09-17 MED ORDER — SENNOSIDES-DOCUSATE SODIUM 8.6-50 MG PO TABS: 1.0000 | ORAL_TABLET | Freq: Every evening | ORAL | Status: DC | PRN

## 2015-09-17 MED ORDER — FENTANYL CITRATE (PF) 100 MCG/2ML IJ SOLN
INTRAMUSCULAR | Status: DC | PRN
  Administered 2015-09-17 (×2): 50 ug via INTRAVENOUS
  Administered 2015-09-17: 150 ug via INTRAVENOUS
  Administered 2015-09-17: 50 ug via INTRAVENOUS

## 2015-09-17 MED ORDER — MEPERIDINE HCL 25 MG/ML IJ SOLN: 6.2500 mg | INTRAMUSCULAR | Status: DC | PRN

## 2015-09-17 MED ORDER — IOPAMIDOL (ISOVUE-300) INJECTION 61%
INTRAVENOUS | Status: AC
  Filled 2015-09-17: qty 50

## 2015-09-17 MED ORDER — BISACODYL 5 MG PO TBEC: 5.0000 mg | DELAYED_RELEASE_TABLET | Freq: Every day | ORAL | Status: DC | PRN

## 2015-09-17 MED ORDER — PROTAMINE SULFATE 10 MG/ML IV SOLN
INTRAVENOUS | Status: DC | PRN
  Administered 2015-09-17: 20 mg via INTRAVENOUS
  Administered 2015-09-17 (×3): 10 mg via INTRAVENOUS

## 2015-09-17 MED ORDER — SODIUM CHLORIDE 0.9 % IV SOLN
INTRAVENOUS | Status: DC | PRN
  Administered 2015-09-17: 500 mL

## 2015-09-17 SURGICAL SUPPLY — 62 items
BANDAGE ESMARK 6X9 LF (GAUZE/BANDAGES/DRESSINGS) IMPLANT
BENZOIN TINCTURE PRP APPL 2/3 (GAUZE/BANDAGES/DRESSINGS) ×12 IMPLANT
BNDG ESMARK 6X9 LF (GAUZE/BANDAGES/DRESSINGS)
CANISTER SUCTION 2500CC (MISCELLANEOUS) ×3 IMPLANT
CANNULA VESSEL 3MM 2 BLNT TIP (CANNULA) ×9 IMPLANT
CATH FOLEY 2WAY SLVR  5CC 16FR (CATHETERS) ×2
CATH FOLEY 2WAY SLVR 5CC 16FR (CATHETERS) ×1 IMPLANT
CLIP LIGATING EXTRA MED SLVR (CLIP) ×3 IMPLANT
CLIP LIGATING EXTRA SM BLUE (MISCELLANEOUS) ×3 IMPLANT
CLOSURE STERI-STRIP 1/2X4 (GAUZE/BANDAGES/DRESSINGS) ×4
CLOSURE WOUND 1/2 X4 (GAUZE/BANDAGES/DRESSINGS) ×2
CLSR STERI-STRIP ANTIMIC 1/2X4 (GAUZE/BANDAGES/DRESSINGS) ×8 IMPLANT
COVER SURGICAL LIGHT HANDLE (MISCELLANEOUS) ×3 IMPLANT
CUFF TOURNIQUET SINGLE 34IN LL (TOURNIQUET CUFF) IMPLANT
CUFF TOURNIQUET SINGLE 44IN (TOURNIQUET CUFF) IMPLANT
DRAIN SNY 10X20 3/4 PERF (WOUND CARE) IMPLANT
DRAPE PROXIMA HALF (DRAPES) IMPLANT
DRAPE X-RAY CASS 24X20 (DRAPES) IMPLANT
DRSG COVADERM 4X10 (GAUZE/BANDAGES/DRESSINGS) ×6 IMPLANT
DRSG COVADERM 4X6 (GAUZE/BANDAGES/DRESSINGS) IMPLANT
DRSG COVADERM 4X8 (GAUZE/BANDAGES/DRESSINGS) ×6 IMPLANT
ELECT REM PT RETURN 9FT ADLT (ELECTROSURGICAL) ×3
ELECTRODE REM PT RTRN 9FT ADLT (ELECTROSURGICAL) ×1 IMPLANT
EVACUATOR SILICONE 100CC (DRAIN) IMPLANT
GAUZE SPONGE 4X4 12PLY STRL (GAUZE/BANDAGES/DRESSINGS) ×3 IMPLANT
GLOVE BIO SURGEON STRL SZ7.5 (GLOVE) ×3 IMPLANT
GLOVE BIOGEL PI IND STRL 6.5 (GLOVE) ×4 IMPLANT
GLOVE BIOGEL PI IND STRL 7.5 (GLOVE) ×2 IMPLANT
GLOVE BIOGEL PI IND STRL 8 (GLOVE) ×1 IMPLANT
GLOVE BIOGEL PI INDICATOR 6.5 (GLOVE) ×8
GLOVE BIOGEL PI INDICATOR 7.5 (GLOVE) ×4
GLOVE BIOGEL PI INDICATOR 8 (GLOVE) ×2
GLOVE ECLIPSE 6.5 STRL STRAW (GLOVE) ×3 IMPLANT
GLOVE ECLIPSE 7.0 STRL STRAW (GLOVE) ×6 IMPLANT
GLOVE SS BIOGEL STRL SZ 7.5 (GLOVE) ×1 IMPLANT
GLOVE SUPERSENSE BIOGEL SZ 7.5 (GLOVE) ×2
GOWN STRL REUS W/ TWL LRG LVL3 (GOWN DISPOSABLE) ×7 IMPLANT
GOWN STRL REUS W/TWL LRG LVL3 (GOWN DISPOSABLE) ×14
INSERT FOGARTY SM (MISCELLANEOUS) IMPLANT
KIT BASIN OR (CUSTOM PROCEDURE TRAY) ×3 IMPLANT
KIT ROOM TURNOVER OR (KITS) ×3 IMPLANT
NS IRRIG 1000ML POUR BTL (IV SOLUTION) ×6 IMPLANT
PACK PERIPHERAL VASCULAR (CUSTOM PROCEDURE TRAY) ×3 IMPLANT
PAD ARMBOARD 7.5X6 YLW CONV (MISCELLANEOUS) ×6 IMPLANT
PADDING CAST COTTON 6X4 STRL (CAST SUPPLIES) IMPLANT
SET COLLECT BLD 21X3/4 12 (NEEDLE) IMPLANT
STAPLER VISISTAT 35W (STAPLE) IMPLANT
STOPCOCK 4 WAY LG BORE MALE ST (IV SETS) IMPLANT
STRIP CLOSURE SKIN 1/2X4 (GAUZE/BANDAGES/DRESSINGS) ×4 IMPLANT
SUT ETHILON 3 0 PS 1 (SUTURE) IMPLANT
SUT PROLENE 5 0 C 1 24 (SUTURE) ×3 IMPLANT
SUT PROLENE 6 0 CC (SUTURE) ×15 IMPLANT
SUT SILK 2 0 SH (SUTURE) ×3 IMPLANT
SUT SILK 4 0 (SUTURE) ×2
SUT SILK 4-0 18XBRD TIE 12 (SUTURE) ×1 IMPLANT
SUT VIC AB 2-0 CTX 36 (SUTURE) ×6 IMPLANT
SUT VIC AB 3-0 SH 27 (SUTURE) ×6
SUT VIC AB 3-0 SH 27X BRD (SUTURE) ×3 IMPLANT
TRAY FOLEY W/METER SILVER 16FR (SET/KITS/TRAYS/PACK) ×3 IMPLANT
TUBING EXTENTION W/L.L. (IV SETS) IMPLANT
UNDERPAD 30X30 (UNDERPADS AND DIAPERS) ×3 IMPLANT
WATER STERILE IRR 1000ML POUR (IV SOLUTION) ×3 IMPLANT

## 2015-09-17 NOTE — Transfer of Care (Signed)
Immediate Anesthesia Transfer of Care Note  Patient: Nathaniel Curtis  Procedure(s) Performed: Procedure(s): LEFT FEMORAL-POPLITEAL ARTERY  BYPASS WITH SAPHENOUS VEIN (Left) LEFT SAPHENOUS VEIN HARVEST (Left)  Patient Location: PACU  Anesthesia Type:General  Level of Consciousness: awake, alert  and oriented  Airway & Oxygen Therapy: Patient Spontanous Breathing  Post-op Assessment: Report given to RN, Post -op Vital signs reviewed and stable and Patient moving all extremities X 4  Post vital signs: Reviewed and stable  Last Vitals:  Vitals:   09/16/15 2058 09/17/15 0507  BP: 122/61 111/90  Pulse: 79 73  Resp: 18 16  Temp: 36.9 C 37 C    Last Pain:  Vitals:   09/17/15 0507  TempSrc: Oral  PainSc:          Complications: No apparent anesthesia complications

## 2015-09-17 NOTE — Anesthesia Preprocedure Evaluation (Addendum)
Anesthesia Evaluation  Patient identified by MRN, date of birth, ID band Patient awake    Reviewed: Allergy & Precautions, NPO status , Patient's Chart, lab work & pertinent test results  Airway Mallampati: I  TM Distance: >3 FB Neck ROM: Full    Dental  (+) Dental Advisory Given, Edentulous Upper, Poor Dentition, Missing,    Pulmonary shortness of breath and with exertion, COPD, Current Smoker,    Pulmonary exam normal        Cardiovascular hypertension, + Past MI, + Peripheral Vascular Disease and +CHF  Normal cardiovascular exam  09/16/15 Echo: Study Conclusions  - Left ventricle: Diffuse hypokinesis of LV with inferior akinesis. The cavity size was moderately dilated. Wall thickness was normal. Systolic function was moderately to severely reduced. The estimated ejection fraction was in the range of 30% to 35%. - Mitral valve: There was mild regurgitation. - Left atrium: The atrium was severely dilated. - Right atrium: The atrium was mildly dilated.   Neuro/Psych    GI/Hepatic (+)     substance abuse  alcohol use,   Endo/Other    Renal/GU      Musculoskeletal  (+) Arthritis ,   Abdominal   Peds  Hematology   Anesthesia Other Findings   Reproductive/Obstetrics                          Anesthesia Physical Anesthesia Plan  ASA: III  Anesthesia Plan: General   Post-op Pain Management:    Induction: Intravenous  Airway Management Planned: Oral ETT  Additional Equipment:   Intra-op Plan:   Post-operative Plan: Extubation in OR  Informed Consent: I have reviewed the patients History and Physical, chart, labs and discussed the procedure including the risks, benefits and alternatives for the proposed anesthesia with the patient or authorized representative who has indicated his/her understanding and acceptance.   Dental advisory given  Plan Discussed with: CRNA and  Surgeon  Anesthesia Plan Comments:        Anesthesia Quick Evaluation

## 2015-09-17 NOTE — Progress Notes (Signed)
PROGRESS NOTE                                                                                                                                                                                                             Patient Demographics:    Nathaniel Curtis, is a 82 y.o. male, DOB - 16-Aug-1931, SWF:[id number]  Admit date - 09/15/2015   Admitting Physician Ozella Rocks, MD  Outpatient Primary MD for the patient is CAMPBELL, Mila Homer., MD  LOS - 2  No chief complaint on file.      Brief Narrative     Nathaniel Curtis is a 82 y.o. male with history of diastolic CHF last EF measured was 55-60% in 2013, peripheral vascular disease status post femoral bypass in 2013 by Dr. Arbie Cookey had presented to the ER at [facility] with nonhealing ulcers of his lower extremity on his foot and toes with worsening pain. CT angiographically done shows near occlusion of left SFA and right SFA with proximal popliteal reconstitution. Since patient has worsening pain and nonhealing ulcers and patient is known to Dr. Arbie Cookey patient is being transferred to [facility] for further management. On exam patient has multiple ulcers on the left foot distal toes and also on the right dorsum of his foot. There is mild discharge. X-rays done at [facility] did not show any definite osteomyelitis.   ED Course: Was a direct admit.   Subjective:    Nathaniel Curtis today has, No headache, No chest pain, No abdominal pain - No Nausea, No new weakness tingling or numbness, No Cough - SOB. Does have pain in both legs left more than right, he is currently in PACU after his procedure and feels good.   Assessment  & Plan :     1.Bilateral lower extremity pain with bilateral foot ulcers due to bilateral SFA PAD. L > R  Dr. Arbie Cookey vascular surgeon on board, currently no signs of sepsis or severe infection, continue doxycycline, placed on aspirin and Lipitor, he is  post left femoropopliteal bypass on 09/17/2015, seems to have tolerated the procedure well, we'll continue to monitor. Appreciate cardiology and vascular surgery input.   2. History of CAD and chronic mine diastolic and systolic heart failure EF now 22%. Currently appears compensated. Cardiology going, Currently chest pain-free, placed on aspirin and statin for secondary  prevention. Has has needed IV hydralazine and Lopressor, stop IV fluids once taking oral diet. We'll continue to monitor closely.  3. History of smoking. Counseled to quit, says he quit alcohol several years ago.  4. Questionable ARF at [facility]. Creatinine normal here. We will monitor.    Family Communication  : None present  Code Status :  Full  Diet : Heart Healthy  Disposition Plan  :  Tele  Consults  : Cardiology, vascular surgery  Procedures  :   TTE - Right ABI indicates severe reduction of arterial blood flow at rest. Left ABI indicates moderate to severe redcution of arterial blood flow at rest.  Left femoral to below-knee popliteal bypass with reversed great saphenous vein by Dr Arbie Cookey 09-17-15   DVT Prophylaxis  :    Heparin   Lab Results  Component Value Date   PLT 395 09/17/2015    Inpatient Medications  Scheduled Meds: . [MAR Hold] aspirin  81 mg Oral Daily  . [MAR Hold] atorvastatin  20 mg Oral q1800  . [MAR Hold] doxycycline (VIBRAMYCIN) IV  100 mg Intravenous BID  . [MAR Hold] heparin subcutaneous  5,000 Units Subcutaneous Q8H  . HYDROmorphone       Continuous Infusions:  PRN Meds:.[MAR Hold] acetaminophen **OR** [MAR Hold] acetaminophen, [MAR Hold] albuterol, HYDROmorphone (DILAUDID) injection, meperidine (DEMEROL) injection, [MAR Hold] metoprolol, [MAR Hold]  morphine injection, [MAR Hold] ondansetron **OR** [MAR Hold] ondansetron (ZOFRAN) IV, ondansetron (ZOFRAN) IV, [MAR Hold] oxyCODONE  Antibiotics  :    Anti-infectives    Start     Dose/Rate Route Frequency Ordered Stop    09/17/15 0600  [MAR Hold]  cefUROXime (ZINACEF) 1.5 g in dextrose 5 % 50 mL IVPB     (MAR Hold since 09/17/15 0638)   1.5 g 100 mL/hr over 30 Minutes Intravenous To ShortStay Surgical 09/16/15 0810 09/17/15 0804   09/16/15 0030  [MAR Hold]  doxycycline (VIBRAMYCIN) 100 mg in dextrose 5 % 250 mL IVPB     (MAR Hold since 09/17/15 0638)   100 mg 125 mL/hr over 120 Minutes Intravenous 2 times daily 09/15/15 2343           Objective:   Vitals:   09/17/15 1045 09/17/15 1100 09/17/15 1115 09/17/15 1130  BP: (!) 124/51 (!) 110/50 (!) 108/51 (!) 110/52  Pulse: 74 79 79 72  Resp: 20 16 16 12   Temp: 98.5 F (36.9 C)     TempSrc:      SpO2: 96% 95% 96% 97%  Weight:      Height:        Wt Readings from Last 3 Encounters:  09/16/15 48.5 kg (107 lb)  11/28/11 63 kg (139 lb)  10/15/11 72 kg (158 lb 11.7 oz)     Intake/Output Summary (Last 24 hours) at 09/17/15 1200 Last data filed at 09/17/15 1145  Gross per 24 hour  Intake             1440 ml  Output             1755 ml  Net             -315 ml     Physical Exam  Awake Alert, Oriented X 3, No new F.N deficits, Normal affect Nathaniel Curtis,PERRAL Supple Neck,No JVD, No cervical lymphadenopathy appriciated.  Symmetrical Chest wall movement, Good air movement bilaterally, CTAB RRR,No Gallops,Rubs or new Murmurs, No Parasternal Heave +ve B.Sounds, Abd Soft, No tenderness, No organomegaly appriciated, No rebound - guarding  or rigidity. No Cyanosis, Clubbing or edema, No new Rash or bruise  Both lower extremities have signs of chronic ischemia, bilateral lower extremity digits show signs of ischemia with left leg having ulcer on the dorsal aspect of the foot which is irregular in shape and about 3 cm in diameter    Data Review:    CBC  Recent Labs Lab 09/16/15 0020 09/17/15 0514  WBC 8.7 9.5  HGB 10.3* 10.1*  HCT 31.5* 30.7*  PLT 398 395  MCV 92.1 91.6  MCH 30.1 30.1  MCHC 32.7 32.9  RDW 16.6* 16.2*  LYMPHSABS 1.5  --     MONOABS 0.7  --   EOSABS 0.8*  --   BASOSABS 0.1  --     Chemistries   Recent Labs Lab 09/16/15 0020 09/17/15 0514  NA 134* 132*  K 4.4 4.2  CL 103 98*  CO2 25 26  GLUCOSE 92 81  BUN 13 10  CREATININE 1.01 1.03  CALCIUM 8.4* 8.6*  AST 19  --   ALT 12*  --   ALKPHOS 70  --   BILITOT 0.4  --    ------------------------------------------------------------------------------------------------------------------ No results for input(s): CHOL, HDL, LDLCALC, TRIG, CHOLHDL, LDLDIRECT in the last 72 hours.  No results found for: HGBA1C ------------------------------------------------------------------------------------------------------------------ No results for input(s): TSH, T4TOTAL, T3FREE, THYROIDAB in the last 72 hours.  Invalid input(s): FREET3 ------------------------------------------------------------------------------------------------------------------ No results for input(s): VITAMINB12, FOLATE, FERRITIN, TIBC, IRON, RETICCTPCT in the last 72 hours.  Coagulation profile  Recent Labs Lab 09/17/15 0514  INR 1.10    No results for input(s): DDIMER in the last 72 hours.  Cardiac Enzymes No results for input(s): CKMB, TROPONINI, MYOGLOBIN in the last 168 hours.  Invalid input(s): CK ------------------------------------------------------------------------------------------------------------------ No results found for: BNP  Micro Results No results found for this or any previous visit (from the past 240 hour(s)).  Radiology Reports No results found.  Time Spent in minutes  30   Khali Albanese K M.D on 09/17/2015 at 12:00 PM  Between 7am to 7pm - Pager - [phone]  After 7pm go to [email or web address] - password [facility]  [facility] -  Office  [phone]

## 2015-09-17 NOTE — Interval H&P Note (Signed)
History and Physical Interval Note:  09/17/2015 7:20 AM  Nathaniel Curtis  has presented today for surgery, with the diagnosis of Peripheral Vascular Disease with Ischemic pain of feet  I70.223  The various methods of treatment have been discussed with the patient and family. After consideration of risks, benefits and other options for treatment, the patient has consented to  Procedure(s): BYPASS GRAFT FEMORAL-POPLITEAL ARTERY- LEFT (Left) as a surgical intervention .  The patient's history has been reviewed, patient examined, no change in status, stable for surgery.  I have reviewed the patient's chart and labs.  Questions were answered to the patient's satisfaction.     Gretta Began

## 2015-09-17 NOTE — Progress Notes (Signed)
    I have reviewed the echo . The LV function is modertely - severely reduced with an EF of 30-35%. He informed me that an echo in 2015 performed in [place], [place] showed a reduced EF of 45%.  He has no signs or symptoms of decompensated CHF. He is able to lie flat without orthopnea. I think that he is euvolemic. He has no symptoms of angina.   He has critical limb ischemia and needs to have urgent revascularization for limb salvage.  I think that we can proceed with the planned surgery .  He is at moderate risk for CV complications     Kristeen Miss, MD  09/17/2015 5:08 AM    [facility] [address] [place],  [address] [place], [place]  [address] Pager [phone] Phone: [phone]; Fax: [phone]

## 2015-09-17 NOTE — Anesthesia Procedure Notes (Signed)
Procedure Name: Intubation Date/Time: 09/17/2015 7:36 AM Performed by: Rise Patience T Pre-anesthesia Checklist: Patient identified, Emergency Drugs available, Suction available and Patient being monitored Patient Re-evaluated:Patient Re-evaluated prior to inductionOxygen Delivery Method: Circle System Utilized Preoxygenation: Pre-oxygenation with 100% oxygen Intubation Type: IV induction Ventilation: Mask ventilation without difficulty and Oral airway inserted - appropriate to patient size Laryngoscope Size: Hyacinth Meeker and 2 Grade View: Grade I Tube type: Oral Tube size: 7.5 mm Number of attempts: 1 Airway Equipment and Method: Stylet and Oral airway Placement Confirmation: ETT inserted through vocal cords under direct vision,  positive ETCO2 and breath sounds checked- equal and bilateral Secured at: 23 cm Tube secured with: Tape Dental Injury: Teeth and Oropharynx as per pre-operative assessment

## 2015-09-17 NOTE — Op Note (Signed)
    OPERATIVE REPORT  DATE OF SURGERY: 09/17/2015  PATIENT: Nathaniel Curtis, 82 y.o. male MRN: [id number]  DOB: 1931-03-27  PRE-OPERATIVE DIAGNOSIS: Critical limb ischemia left leg  POST-OPERATIVE DIAGNOSIS:  Same  PROCEDURE: Left femoral to below-knee popliteal bypass with reversed great saphenous vein  SURGEON:  Gretta Began, M.D.  PHYSICIAN ASSISTANT: Dr. Cari Caraway, Thomasena Edis PA-C  ANESTHESIA:  Gen.   EBL: 50 ml  Total I/O In: 1200 [I.V.:1200] Out: 505 [Urine:455; Blood:50]  BLOOD ADMINISTERED: None  DRAINS: None  SPECIMEN: None  COUNTS CORRECT:  YES  PLAN OF CARE: PACU   PATIENT DISPOSITION:  PACU - hemodynamically stable  PROCEDURE DETAILS: Patient was taken to the operative placed supine position where the area of the left groin left leg prepped and draped in usual sterile fashion. Incision was made to the prior groin incision carried down to isolate his old aortofemoral bypass left limb. The hood of this was exposed for inflow. The saphenous vein was identified to the groin incision. Was of moderate size. Tributary branches were ligated with 301 4-0 silk ties and divided. The saphenous vein was unroofed from the level of groin to the below-knee popliteal position through multiple small skin bridges. The vein was small to moderate sized in the distal leg. The below-knee popliteal artery was exposed through the below-knee vein harvest incision. Patient had excellent size with minimal atherosclerotic change to the below-knee popliteal artery. The vein was ligated distally and divided and was also ligated at the saphenofemoral junction and divided. EMS cannulas placed in the distal vein and this was gently dilated. The vein was of the nearly uniform caliber throughout its length. I was small throughout its length. The vein was reversed. A tunnel was created from the level of the below-knee popliteal artery to the groin. The patient was given 5000 units intravenous heparin and  after adequate circulation time the hood of the old aortofemoral graft was occluded with a partial occlusion clamp. An ellipse of graft was removed and the vein was spatulated and sewn end-to-side to the hood of the old aortofemoral graft with a running 6-0 Prolene suture. Clamps were removed and good flow was noted through the vein graft. The vein is been marked to prevent twisting and the tunnel. The vein was brought to the prior created tunnel down to the level of the below-knee popliteal artery. The below-knee popliteal artery was occluded proximally and distally and was opened with 11 blade some ulcerative with Potts scissors. The vein was cut to the appropriate length and was spatulated and sewn end-to-side to the artery with a running 6-0 Prolene suture. 3 dilator passed easily proximally and distally prior to closure of the anastomosis. Anastomosis was completed and clamps removed. Graft dependent Doppler flow is noted at the posterior tibial just above the ankle. The patient was given 50 mg of protamine to reverse the heparin. Wounds irrigated with saline. Hemostasis tablet cautery. Wounds were closed with 3-0 Vicryl in the subcutaneous and subcuticular tissue. Benzoin and Steri-Strips and sterile dressing was applied   Gretta Began, M.D. 09/17/2015 11:18 AM

## 2015-09-17 NOTE — Progress Notes (Signed)
  [facility] Day of Surgery Note  Subjective:  Left leg incisions are sore.   Vitals:   09/17/15 1130 09/17/15 1300  BP: (!) 110/52 (!) 118/49  Pulse: 72 69  Resp: 12 12  Temp:  98 F (36.7 C)   Left groin and leg incisions dressed. Minimal sanguinous drainage on bandages.  Brisk left peroneal doppler signal, mixed venous/arterial left PT doppler signal.  Feet are dressed bilaterally.  Assessment/Plan:  This is a 82 y.o. male who is s/p left femoral to below-knee popliteal bypass with reversed great saphenous vein  Stable post-op.  Good doppler flow left foot.   Nathaniel Curtis, [place] Pager: [phone] 09/17/2015 2:00 PM

## 2015-09-17 NOTE — Anesthesia Postprocedure Evaluation (Signed)
Anesthesia Post Note  Patient: Nathaniel Curtis  Procedure(s) Performed: Procedure(s) (LRB): LEFT FEMORAL-POPLITEAL ARTERY  BYPASS WITH SAPHENOUS VEIN (Left) LEFT SAPHENOUS VEIN HARVEST (Left)  Patient location during evaluation: PACU Anesthesia Type: General Level of consciousness: awake and alert Pain management: pain level controlled Vital Signs Assessment: post-procedure vital signs reviewed and stable Respiratory status: spontaneous breathing, nonlabored ventilation, respiratory function stable and patient connected to nasal cannula oxygen Cardiovascular status: blood pressure returned to baseline and stable Postop Assessment: no signs of nausea or vomiting Anesthetic complications: no    Last Vitals:  Vitals:   09/17/15 1540 09/17/15 1600  BP:  (!) 115/52  Pulse:  78  Resp:  15  Temp: 36.8 C     Last Pain:  Vitals:   09/17/15 1540  TempSrc: Oral  PainSc:                  Collier Monica DAVID

## 2015-09-17 NOTE — OR Nursing (Signed)
Dentures to PACU with Patient. Justin Mend, RN

## 2015-09-17 NOTE — H&P (View-Only) (Signed)
Patient name: Nathaniel Curtis MRN: [id number] DOB: [birth year] Sex: male   Referred by: [facility]  Reason for referral lateral lower extremity critical limb ischemia  HISTORY OF PRESENT ILLNESS: 82 year old gentleman who presented to [facility] several days ago with progressive tissue loss on both lower extremities. He reports this is been present for approximately 6 weeks. He has been having severe pain in both lower femoris for in excess of 4 months. He has not been having a great amount of medical care. He denies any fevers or any suggestion of sepsis related to his lower extremity tissue loss. He does have near constant pain associated with this bilaterally. Reports that the left side is slightly worse than the right. He underwent CT angiogram that [facility] showing infrainguinal occlusive disease and was transferred to [facility] for further evaluation and treatment. The patient is well-known to me from a prior aortobifemoral bypass in 2013. At that time he had presented with critical limb ischemia and gangrenous changes on both feet. He did well following his aortofemoral bypass eventually healing all the tissue loss on his feet and has done well for 5 years. CT scan revealed that the aortobifemoral bypass was widely patent and that his occlusive disease is now in for inguinal. Does have a history of myocardial infarction in 2009. Echocardiogram in 2013 the time of the surgery revealed a well-preserved ejection fraction at 50-55%. He denies any chest pain or other cardiac events and has no suggestion of congestive failure. Does have COPD and ongoing cigarette abuse  Past Medical History:  Diagnosis Date  . Arthritis   . CHF (congestive heart failure) (HCC)    Echo 09/26/11 - EF 50-55%, grade 1 diastolic dysfunction, no RWMAs, no significant valvular abnls  . ETOH abuse    70+ yrs, 3 glasses of brandy/night   . Glaucoma   . Heart attack (HCC) 2009   found on EKG, no cath  .  Hyperlipidemia   . PVD (peripheral vascular disease) (HCC)    severe iliac disease  . Tobacco abuse    1ppd for 70+yrs    Past Surgical History:  Procedure Laterality Date  . AORTA - BILATERAL FEMORAL ARTERY BYPASS GRAFT  10/13/2011   Procedure: AORTA BIFEMORAL BYPASS GRAFT;  Surgeon: Larina Earthly, MD;  Location: [facility] OR;  Service: Vascular;  Laterality: N/A;  Aortic bifemoral bypass with 16mm x 79mm graft.    Marland Kitchen CARDIAC CATHETERIZATION  10/2007  . CATARACT EXTRACTION W/ INTRAOCULAR LENS IMPLANT  RIGHT EYE 2009  . EYE SURGERY  02/09    Social History   Social History  . Marital status: Widowed    Spouse name: N/A  . Number of children: N/A  . Years of education: N/A   Occupational History  . Not on file.   Social History Main Topics  . Smoking status: Current Every Day Smoker    Packs/day: 1.00    Years: 70.00    Types: Cigarettes  . Smokeless tobacco: Former Neurosurgeon  . Alcohol use Yes     Comment: 70+ years, 3 glasses of brandy a night  . Drug use: No  . Sexual activity: Not on file   Other Topics Concern  . Not on file   Social History Narrative  . No narrative on file    Family History  Problem Relation Age of Onset  . Heart disease Maternal Grandfather     MI 3yo    Allergies as of 09/15/2015 - Review  Complete 09/15/2015  Allergen Reaction Noted  . Lyrica [pregabalin] Swelling 08/31/2010    No current facility-administered medications on file prior to encounter.    Current Outpatient Prescriptions on File Prior to Encounter  Medication Sig Dispense Refill  . nitroGLYCERIN (NITROSTAT) 0.4 MG SL tablet Place 0.4 mg under the tongue every 5 (five) minutes as needed. As needed for chest pain.    Marland Kitchen oxyCODONE-acetaminophen (PERCOCET/ROXICET) 5-325 MG per tablet Take 1-2 tablets by mouth every 4 (four) hours as needed. (Patient not taking: Reported on 09/15/2015) 30 tablet 0  . traMADol (ULTRAM) 50 MG tablet Take 1 tablet (50 mg total) by mouth every 6 (six) hours  as needed for pain. (Patient not taking: Reported on 09/15/2015) 30 tablet 0     REVIEW OF SYSTEMS:  Positives indicated with an "X"  CARDIOVASCULAR:  [ ]  chest pain   [ ]  chest pressure   [ ]  palpitations   [ ]  orthopnea   [ ]  dyspnea on exertion   [ ]  claudication   [ ]  rest pain   [ ]  DVT   [ ]  phlebitis PULMONARY:   [ ]  productive cough   [ ]  asthma   [ ]  wheezing NEUROLOGIC:   [ ]  weakness  [ ]  paresthesias  [ ]  aphasia  [ ]  amaurosis  [ ]  dizziness HEMATOLOGIC:   [ ]  bleeding problems   [ ]  clotting disorders MUSCULOSKELETAL:  [x ] joint pain   [ ]  joint swelling GASTROINTESTINAL: [ ]   blood in stool  [ ]   hematemesis GENITOURINARY:  [ ]   dysuria  [ ]   hematuria PSYCHIATRIC:  [ ]  history of major depression INTEGUMENTARY:  [ x] rashes  [x ] ulcers CONSTITUTIONAL:  [ ]  fever   [ ]  chills  PHYSICAL EXAMINATION:  General: The patient is a well-nourished male, in no acute distress. Vital signs are BP (!) 129/59 (BP Location: Left Arm)   Pulse 69   Temp 99.2 F (37.3 C) (Oral)   Resp 18   Wt 118 lb (53.5 kg)   SpO2 91%   BMI 18.48 kg/m  Pulmonary: There is a good air exchange bilaterally without wheezing or rales. Abdomen: Soft and non-tender with normal pitch bowel sounds. Musculoskeletal: Severe changes of rheumatoid arthritis in both hands. Neurologic: No focal weakness or paresthesias are detected, Skin: Superficial ulceration over the dorsum of his left foot extending on his toes. S extensive superficial ulceration on his right dorsum and less extension onto the toes Psychiatric: The patient has normal affect. Cardiovascular: There is a regular rate and rhythm without significant murmur appreciated. 2+ radial and 2+ femoral pulses. Absent popliteal and distal pulses   CT angiogram from [facility] shows wide patency of his aortofemoral bypass. He has complete occlusion at the origin of the superficial femoral arteries bilaterally with reconstitution of the  popliteal artery above the knee but with disease at the knee. He does have better flow in the below-knee popliteal artery with good tibial runoff.  Impression and Plan:  Bilateral critical limb ischemia left greater than right with severe rest pain and tissue loss. His left foot pain is worse and his tissue loss is greater on the left. Would recommend left femoral to popliteal bypass for limb salvage. Would then follow-up with right femoral-popliteal bypass hopefully in several weeks if his right leg remained stable during this time. Has been stable from a cardiac standpoint since his last major operation in 2013. Have asked cardiology for assistance  perioperatively as well.    Darry Kelnhofer [facility] [place] Office: [phone]        

## 2015-09-18 LAB — BASIC METABOLIC PANEL
ANION GAP: 6 (ref 5–15)
BUN: 11 mg/dL (ref 6–20)
CHLORIDE: 101 mmol/L (ref 101–111)
CO2: 26 mmol/L (ref 22–32)
Calcium: 8.4 mg/dL — ABNORMAL LOW (ref 8.9–10.3)
Creatinine, Ser: 1.05 mg/dL (ref 0.61–1.24)
GFR calc non Af Amer: >60 mL/min (ref >60)
Glucose, Bld: 85 mg/dL (ref 65–99)
POTASSIUM: 4.1 mmol/L (ref 3.5–5.1)
SODIUM: 133 mmol/L — AB (ref 135–145)

## 2015-09-18 LAB — CBC
HCT: 28.2 % — ABNORMAL LOW (ref 39.0–52.0)
HEMOGLOBIN: 9.1 g/dL — AB (ref 13.0–17.0)
MCH: 29.8 pg (ref 26.0–34.0)
MCHC: 32.3 g/dL (ref 30.0–36.0)
MCV: 92.5 fL (ref 78.0–100.0)
Platelets: 366 10*3/uL (ref 150–400)
RBC: 3.05 MIL/uL — AB (ref 4.22–5.81)
RDW: 16.1 % — ABNORMAL HIGH (ref 11.5–15.5)
WBC: 8.9 10*3/uL (ref 4.0–10.5)

## 2015-09-18 MED ORDER — METOPROLOL SUCCINATE ER 25 MG PO TB24
25.0000 mg | ORAL_TABLET | Freq: Every day | ORAL | Status: DC
  Administered 2015-09-18 – 2015-09-20 (×3): 25 mg via ORAL
  Filled 2015-09-18 (×3): qty 1

## 2015-09-18 MED ORDER — LISINOPRIL 10 MG PO TABS
10.0000 mg | ORAL_TABLET | Freq: Every day | ORAL | Status: DC
  Administered 2015-09-18 – 2015-09-20 (×3): 10 mg via ORAL
  Filled 2015-09-18 (×3): qty 1

## 2015-09-18 MED ORDER — MAGNESIUM CITRATE PO SOLN
1.0000 | Freq: Once | ORAL | Status: AC
  Administered 2015-09-18: 1 via ORAL
  Filled 2015-09-18: qty 296

## 2015-09-18 MED ORDER — POLYETHYLENE GLYCOL 3350 17 G PO PACK
17.0000 g | PACK | Freq: Two times a day (BID) | ORAL | Status: DC
  Administered 2015-09-18 – 2015-09-19 (×3): 17 g via ORAL
  Filled 2015-09-18 (×5): qty 1

## 2015-09-18 MED ORDER — DOCUSATE SODIUM 100 MG PO CAPS
200.0000 mg | ORAL_CAPSULE | Freq: Two times a day (BID) | ORAL | Status: DC
  Administered 2015-09-18 – 2015-09-19 (×3): 200 mg via ORAL
  Filled 2015-09-18 (×5): qty 2

## 2015-09-18 NOTE — Progress Notes (Signed)
Pt had a large bowel movement by using own personal digital removal. Despite giving medications to help with a bowel movement, pt states "this is the only way to remove constipation". I offered to contact MD for enema but pt was adamant  about using digits for removal only. Rectum bled a scant amount. Morphine given for pain that was endured before/after episode. 7:07 PM  09/18/15  Hitomi Slape Shelia Media, RN

## 2015-09-18 NOTE — Progress Notes (Signed)
PROGRESS NOTE                                                                                                                                                                                                             Patient Demographics:    Nathaniel Curtis, is a 82 y.o. male, DOB - [birth year], YJE:[id number]  Admit date - 09/15/2015   Admitting Physician Ozella Rocks, MD  Outpatient Primary MD for the patient is CAMPBELL, Mila Homer., MD  LOS - 3  No chief complaint on file.      Brief Narrative     Nathaniel Curtis is a 82 y.o. male with history of diastolic CHF last EF measured was 55-60% in 2013, peripheral vascular disease status post femoral bypass in 2013 by Dr. Arbie Cookey had presented to the ER at [facility] with nonhealing ulcers of his lower extremity on his foot and toes with worsening pain. CT angiographically done shows near occlusion of left SFA and right SFA with proximal popliteal reconstitution. Since patient has worsening pain and nonhealing ulcers and patient is known to Dr. Arbie Cookey patient is being transferred to [facility] for further management. On exam patient has multiple ulcers on the left foot distal toes and also on the right dorsum of his foot. There is mild discharge. X-rays done at [facility] did not show any definite osteomyelitis.   ED Course: Was a direct admit.   Subjective:    Nathaniel Curtis today has, No headache, No chest pain, No abdominal pain - No Nausea, No new weakness tingling or numbness, No Cough - SOB. Does have pain in both legs left more than right In use to have some discomfort after the surgery, He also complains of some constipation.   Assessment  & Plan :     1.Bilateral lower extremity pain with bilateral foot ulcers due to bilateral SFA PAD. L > R  Dr. Arbie Cookey vascular surgeon on board, currently no signs of sepsis or severe infection, continue doxycycline, placed on  aspirin and Lipitor, he is post left femoropopliteal bypass on 09/17/2015, seems to have tolerated the procedure well, we'll continue to monitor. Appreciate cardiology and vascular surgery input. Minimal perioperative blood loss and dilution related anemia not requiring transfusion.   2. History of CAD and chronic mine diastolic and systolic heart failure EF now 63%.  Currently appears compensated. Cardiology going, Currently chest pain-free, placed on aspirin and statin for secondary prevention. Has has needed IV hydralazine and Lopressor. We'll continue to monitor closely.  3. History of smoking. Counseled to quit, says he quit alcohol several years ago.  4. Questionable ARF at [facility]. Creatinine normal here. We will monitor.  5. Constipation. Placed on bowel regimen.    Family Communication  : None present  Code Status :  Full  Diet : Heart Healthy  Disposition Plan  :  Tele  Consults  : Cardiology, vascular surgery  Procedures  :   TTE - Right ABI indicates severe reduction of arterial blood flow at rest. Left ABI indicates moderate to severe redcution of arterial blood flow at rest.  Left femoral to below-knee popliteal bypass with reversed great saphenous vein by Dr Arbie Cookey 09-17-15   DVT Prophylaxis  :    Heparin   Lab Results  Component Value Date   PLT 366 09/18/2015    Inpatient Medications  Scheduled Meds: . aspirin  81 mg Oral Daily  . atorvastatin  20 mg Oral q1800  . docusate sodium  200 mg Oral BID  . doxycycline (VIBRAMYCIN) IV  100 mg Intravenous BID  . heparin subcutaneous  5,000 Units Subcutaneous Q8H  . magnesium citrate  1 Bottle Oral Once  . pantoprazole  40 mg Oral Daily  . polyethylene glycol  17 g Oral BID   Continuous Infusions:  PRN Meds:.sodium chloride, acetaminophen **OR** acetaminophen, albuterol, alum & mag hydroxide-simeth, bisacodyl, guaiFENesin-dextromethorphan, hydrALAZINE, magnesium sulfate 1 - 4 g bolus IVPB, metoprolol, morphine  injection, ondansetron **OR** ondansetron (ZOFRAN) IV, oxyCODONE, oxyCODONE-acetaminophen, phenol, potassium chloride, senna-docusate  Antibiotics  :    Anti-infectives    Start     Dose/Rate Route Frequency Ordered Stop   09/17/15 1930  cefUROXime (ZINACEF) 1.5 g in dextrose 5 % 50 mL IVPB     1.5 g 100 mL/hr over 30 Minutes Intravenous Every 12 hours 09/17/15 1230 09/18/15 0719   09/17/15 0600  cefUROXime (ZINACEF) 1.5 g in dextrose 5 % 50 mL IVPB     1.5 g 100 mL/hr over 30 Minutes Intravenous To ShortStay Surgical 09/16/15 0810 09/17/15 0804   09/16/15 0030  doxycycline (VIBRAMYCIN) 100 mg in dextrose 5 % 250 mL IVPB     100 mg 125 mL/hr over 120 Minutes Intravenous 2 times daily 09/15/15 2343           Objective:   Vitals:   09/18/15 0015 09/18/15 0016 09/18/15 0400 09/18/15 0500  BP: (!) 104/49 (!) 104/49 (!) 107/52 (!) 114/52  Pulse: 76  67 73  Resp: 16  15 18   Temp: 98.5 F (36.9 C) 98.5 F (36.9 C)  98.3 F (36.8 C)  TempSrc: Oral Oral  Oral  SpO2: 93%  96% 95%  Weight:      Height:        Wt Readings from Last 3 Encounters:  09/16/15 48.5 kg (107 lb)  11/28/11 63 kg (139 lb)  10/15/11 72 kg (158 lb 11.7 oz)     Intake/Output Summary (Last 24 hours) at 09/18/15 0934 Last data filed at 09/18/15 0500  Gross per 24 hour  Intake          1901.25 ml  Output             1675 ml  Net           226.25 ml     Physical Exam  Awake Alert, Oriented  X 3, No new F.N deficits, Normal affect [place].AT,PERRAL Supple Neck,No JVD, No cervical lymphadenopathy appriciated.  Symmetrical Chest wall movement, Good air movement bilaterally, CTAB RRR,No Gallops,Rubs or new Murmurs, No Parasternal Heave +ve B.Sounds, Abd Soft, No tenderness, No organomegaly appriciated, No rebound - guarding or rigidity. No Cyanosis, Clubbing or edema, No new Rash or bruise  Both lower extremities have signs of chronic ischemia, bilateral lower extremity digits show signs of ischemia with left  leg having ulcer on the dorsal aspect of the foot which is irregular in shape and about 3 cm in diameter, Both feet and a bandage, left leg femoropopliteal bypass site under bandage and no surrounding infection or cellulitis    Data Review:    CBC  Recent Labs Lab 09/16/15 0020 09/17/15 0514 09/18/15 0427  WBC 8.7 9.5 8.9  HGB 10.3* 10.1* 9.1*  HCT 31.5* 30.7* 28.2*  PLT 398 395 366  MCV 92.1 91.6 92.5  MCH 30.1 30.1 29.8  MCHC 32.7 32.9 32.3  RDW 16.6* 16.2* 16.1*  LYMPHSABS 1.5  --   --   MONOABS 0.7  --   --   EOSABS 0.8*  --   --   BASOSABS 0.1  --   --     Chemistries   Recent Labs Lab 09/16/15 0020 09/17/15 0514 09/18/15 0427  NA 134* 132* 133*  K 4.4 4.2 4.1  CL 103 98* 101  CO2 25 26 26   GLUCOSE 92 81 85  BUN 13 10 11   CREATININE 1.01 1.03 1.05  CALCIUM 8.4* 8.6* 8.4*  AST 19  --   --   ALT 12*  --   --   ALKPHOS 70  --   --   BILITOT 0.4  --   --    ------------------------------------------------------------------------------------------------------------------ No results for input(s): CHOL, HDL, LDLCALC, TRIG, CHOLHDL, LDLDIRECT in the last 72 hours.  No results found for: HGBA1C ------------------------------------------------------------------------------------------------------------------ No results for input(s): TSH, T4TOTAL, T3FREE, THYROIDAB in the last 72 hours.  Invalid input(s): FREET3 ------------------------------------------------------------------------------------------------------------------ No results for input(s): VITAMINB12, FOLATE, FERRITIN, TIBC, IRON, RETICCTPCT in the last 72 hours.  Coagulation profile  Recent Labs Lab 09/17/15 0514  INR 1.10    No results for input(s): DDIMER in the last 72 hours.  Cardiac Enzymes No results for input(s): CKMB, TROPONINI, MYOGLOBIN in the last 168 hours.  Invalid input(s):  CK ------------------------------------------------------------------------------------------------------------------ No results found for: BNP  Micro Results No results found for this or any previous visit (from the past 240 hour(s)).  Radiology Reports No results found.  Time Spent in minutes  30   Giovanie Lefebre K M.D on 09/18/2015 at 9:34 AM  Between 7am to 7pm - Pager - [phone]  After 7pm go to [email or web address] - password [facility]  [facility] -  Office  [phone]

## 2015-09-18 NOTE — Progress Notes (Addendum)
  [facility] Progress Note  Subjective  - POD #1  Pain in left foot improved. No CP.   Objective Vitals:   09/18/15 0400 09/18/15 0500  BP: (!) 107/52 (!) 114/52  Pulse: 67 73  Resp: 15 18  Temp:  98.3 F (36.8 C)    Intake/Output Summary (Last 24 hours) at 09/18/15 0757 Last data filed at 09/18/15 0500  Gross per 24 hour  Intake          1901.25 ml  Output             2005 ml  Net          -103.75 ml   Left groin and left leg incisions are dressed. There is mild old sanguinous drainage on dressings.  Brisk doppler flow to left DP and peroneal. Mixed venous/arterial signal at left PT.  Right PT doppler signal.  Bilateral foot dressings taken down. Multiple ulcerations to dorsum left foot with full thickness skin loss. Wounds are clean. Left foot dorsum ulceration clean. Dry skin sloughing of left foot.   Assessment/Planning: 82 y.o. male is s/p: left femoral to below knee popliteal bypass with reversed great saphenous vein.  1 Day Post-Op   Left foot with good doppler flow. Bypass is patent.  Will need daily dressing changes to feet bilaterally. Orders written.  Once he recovers from this surgery, will need to address right leg.  ABLA tolerating.  Ok to transfer out of SDU to 2W.   Nathaniel Curtis 09/18/2015 7:57 AM --  Laboratory CBC    Component Value Date/Time   WBC 8.9 09/18/2015 0427   HGB 9.1 (L) 09/18/2015 0427   HCT 28.2 (L) 09/18/2015 0427   PLT 366 09/18/2015 0427    BMET    Component Value Date/Time   NA 133 (L) 09/18/2015 0427   K 4.1 09/18/2015 0427   CL 101 09/18/2015 0427   CO2 26 09/18/2015 0427   GLUCOSE 85 09/18/2015 0427   BUN 11 09/18/2015 0427   CREATININE 1.05 09/18/2015 0427   CALCIUM 8.4 (L) 09/18/2015 0427   GFRNONAA >60 09/18/2015 0427   GFRAA >60 09/18/2015 0427    COAG Lab Results  Component Value Date   INR 1.10 09/17/2015   INR 1.41 10/13/2011   INR 1.10 10/10/2011   No results found for:  PTT  Antibiotics Anti-infectives    Start     Dose/Rate Route Frequency Ordered Stop   09/17/15 1930  cefUROXime (ZINACEF) 1.5 g in dextrose 5 % 50 mL IVPB     1.5 g 100 mL/hr over 30 Minutes Intravenous Every 12 hours 09/17/15 1230 09/18/15 0719   09/17/15 0600  cefUROXime (ZINACEF) 1.5 g in dextrose 5 % 50 mL IVPB     1.5 g 100 mL/hr over 30 Minutes Intravenous To ShortStay Surgical 09/16/15 0810 09/17/15 0804   09/16/15 0030  doxycycline (VIBRAMYCIN) 100 mg in dextrose 5 % 250 mL IVPB     100 mg 125 mL/hr over 120 Minutes Intravenous 2 times daily 09/15/15 2343         Nathaniel Berger, PA-C [facility] Office: [phone] Pager: [phone] 09/18/2015 7:57 AM    POD#1 Leg feels better, but still with some pain in his foot and at the incisions Good doppler signal I right foot Transfer to floor ,mobilize   Nathaniel Curtis

## 2015-09-18 NOTE — Progress Notes (Signed)
PT Cancellation Note  Patient Details Name: Nathaniel Curtis MRN: [id number] DOB: [birth year]   Cancelled Treatment:    Reason Eval/Treat Not Completed: Patient declined, no reason specified.  Attempted to see patient x2 today.  Patient declined - had just received lunch.  On second attempt, patient in bathroom - constipated.  Will return tomorrow for PT evaluation.   Vena Austria 09/18/2015, 6:54 PM Durenda Hurt. Renaldo Fiddler, [facility] Acute Rehab Services Pager [phone]

## 2015-09-18 NOTE — Progress Notes (Signed)
Subjective:  Tolerated surgery well.  Denies chest pain or shortness of breath today.  He is asking about having the other leg done.  Objective:  Vital Signs in the last 24 hours: BP (!) 114/52 (BP Location: Left Arm)   Pulse 73   Temp 98.3 F (36.8 C) (Oral)   Resp 18   Ht 5\' 9"  (1.753 m)   Wt 48.5 kg (107 lb)   SpO2 95%   BMI 15.80 kg/m   Physical Exam: Thin bearded male in no acute distress, somewhat disheveled. Lungs:  Clear Cardiac:  Regular rhythm, normal S1 and S2, no S3 Abdomen:  Soft, nontender, no masses Extremities:  Surgical wound noted, ulcerations on feet with reduced distal pulses   Intake/Output from previous day: 08/25 0701 - 08/26 0700 In: 1901.3 [P.O.:240; I.V.:1661.3] Out: 2005 [Urine:1955; Blood:50]  Weight Filed Weights   09/15/15 2127 09/16/15 2058  Weight: 53.5 kg (118 lb) 48.5 kg (107 lb)    Lab Results: Basic Metabolic Panel:  Recent Labs  2059 0514 09/18/15 0427  NA 132* 133*  K 4.2 4.1  CL 98* 101  CO2 26 26  GLUCOSE 81 85  BUN 10 11  CREATININE 1.03 1.05   CBC:  Recent Labs  09/16/15 0020 09/17/15 0514 09/18/15 0427  WBC 8.7 9.5 8.9  NEUTROABS 5.6  --   --   HGB 10.3* 10.1* 9.1*  HCT 31.5* 30.7* 28.2*  MCV 92.1 91.6 92.5  PLT 398 395 366   Telemetry: Sinus rhythm  Assessment/Plan:  1.  Chronic systolic heart failure asymptomatic 2.  Severe peripheral vascular disease 3.  Hyperlipidemia  Recommendations:  Tolerated surgery well.  I would go ahead and initiate heart failure medications since his LV function was depressed.     09/20/15  MD [facility]  09/18/2015, 9:38 AM

## 2015-09-19 LAB — CBC
HCT: 28.3 % — ABNORMAL LOW (ref 39.0–52.0)
HEMOGLOBIN: 9.1 g/dL — AB (ref 13.0–17.0)
MCH: 29.5 pg (ref 26.0–34.0)
MCHC: 32.2 g/dL (ref 30.0–36.0)
MCV: 91.9 fL (ref 78.0–100.0)
Platelets: 369 10*3/uL (ref 150–400)
RBC: 3.08 MIL/uL — AB (ref 4.22–5.81)
RDW: 15.9 % — ABNORMAL HIGH (ref 11.5–15.5)
WBC: 9.8 10*3/uL (ref 4.0–10.5)

## 2015-09-19 LAB — BASIC METABOLIC PANEL
Anion gap: 6 (ref 5–15)
BUN: 19 mg/dL (ref 6–20)
CHLORIDE: 101 mmol/L (ref 101–111)
CO2: 26 mmol/L (ref 22–32)
CREATININE: 1.15 mg/dL (ref 0.61–1.24)
Calcium: 8.7 mg/dL — ABNORMAL LOW (ref 8.9–10.3)
GFR calc Af Amer: >60 mL/min (ref >60)
GFR calc non Af Amer: 58 mL/min — ABNORMAL LOW (ref >60)
GLUCOSE: 89 mg/dL (ref 65–99)
POTASSIUM: 3.8 mmol/L (ref 3.5–5.1)
Sodium: 133 mmol/L — ABNORMAL LOW (ref 135–145)

## 2015-09-19 MED ORDER — DOXYCYCLINE HYCLATE 100 MG PO TABS
100.0000 mg | ORAL_TABLET | Freq: Two times a day (BID) | ORAL | Status: DC
  Administered 2015-09-19 – 2015-09-20 (×2): 100 mg via ORAL
  Filled 2015-09-19 (×2): qty 1

## 2015-09-19 NOTE — Progress Notes (Signed)
Subjective:  Complaints of shortness of breath or chest pain.  Tolerated addition of medications okay yesterday.  Labs and telemetry are stable.  Objective:  Vital Signs in the last 24 hours: BP (!) 121/53   Pulse 60   Temp 97.8 F (36.6 C) (Oral)   Resp 15   Ht 5\' 9"  (1.753 m)   Wt 48.5 kg (107 lb)   SpO2 99%   BMI 15.80 kg/m   Physical Exam: Thin bearded male in no acute distress, somewhat disheveled. Lungs:  Clear Cardiac:  Regular rhythm, normal S1 and S2, no S3 Extremities:  Surgical wound noted, ulcerations on feet with reduced distal pulses   Intake/Output from previous day: 08/26 0701 - 08/27 0700 In: 1080 [P.O.:1080] Out: 1600 [Urine:1600]  Weight Filed Weights   09/15/15 2127 09/16/15 2058  Weight: 53.5 kg (118 lb) 48.5 kg (107 lb)    Lab Results: Basic Metabolic Panel:  Recent Labs  2059 0427 09/19/15 0439  NA 133* 133*  K 4.1 3.8  CL 101 101  CO2 26 26  GLUCOSE 85 89  BUN 11 19  CREATININE 1.05 1.15   CBC:  Recent Labs  09/18/15 0427 09/19/15 0439  WBC 8.9 9.8  HGB 9.1* 9.1*  HCT 28.2* 28.3*  MCV 92.5 91.9  PLT 366 369   Telemetry: Sinus bradycardia  Assessment/Plan:  1.  Chronic systolic heart failure asymptomatic-appears stable postoperatively. 2.  Severe peripheral vascular disease 3.  Hyperlipidemia  Recommendations:  He was started on ace inhibitors and beta blocker yesterday.  Continue aspirin as well as statin.     09/21/15  MD [facility] : [facility]  09/19/2015, 10:40 AM

## 2015-09-19 NOTE — Progress Notes (Signed)
PROGRESS NOTE                                                                                                                                                                                                             Patient Demographics:    Nathaniel Curtis, is a 82 y.o. male, DOB - 01/08/1932, SXJ:[id number]  Admit date - 09/15/2015   Admitting Physician Ozella Rocks, MD  Outpatient Primary MD for the patient is CAMPBELL, Mila Homer., MD  LOS - 4  No chief complaint on file.      Brief Narrative     Nathaniel Curtis is a 82 y.o. male with history of diastolic CHF last EF measured was 55-60% in 2013, peripheral vascular disease status post femoral bypass in 2013 by Dr. Arbie Cookey had presented to the ER at [facility] with nonhealing ulcers of his lower extremity on his foot and toes with worsening pain. CT angiographically done shows near occlusion of left SFA and right SFA with proximal popliteal reconstitution. Since patient has worsening pain and nonhealing ulcers and patient is known to Dr. Arbie Cookey patient is being transferred to [facility] for further management. On exam patient has multiple ulcers on the left foot distal toes and also on the right dorsum of his foot. There is mild discharge. X-rays done at [facility] did not show any definite osteomyelitis.   ED Course: Was a direct admit.   Subjective:    Nathaniel Curtis today has, No headache, No chest pain, No abdominal pain - No Nausea, No new weakness tingling or numbness, No Cough - SOB. Does have pain in both legs left more than right In use to have some discomfort after the surgery, resolved constipation.   Assessment  & Plan :     1.Bilateral lower extremity pain with bilateral foot ulcers due to bilateral SFA PAD. L > R  Dr. Arbie Cookey vascular surgeon on board, currently no signs of sepsis or severe infection, continue doxycycline, placed on aspirin and  Lipitor, he is post left femoropopliteal bypass on 09/17/2015, seems to have tolerated the procedure well, we'll continue to monitor. Appreciate cardiology and vascular surgery input. Minimal perioperative blood loss and dilution related anemia not requiring transfusion.   2. History of CAD and chronic mine diastolic and systolic heart failure EF now 33%. Currently appears compensated. Cardiology  going, Currently chest pain-free, placed on aspirin and statin for secondary prevention. Has has needed IV hydralazine and Lopressor. We'll continue to monitor closely.  3. History of smoking. Counseled to quit, says he quit alcohol several years ago.  4. Questionable ARF at [facility]. Creatinine normal here. We will monitor.  5. Constipation. Placed on bowel regimen with good results.    Family Communication  : None present  Code Status :  Full  Diet : Heart Healthy  Disposition Plan  :  Tele  Consults  : Cardiology, vascular surgery  Procedures  :   TTE - Right ABI indicates severe reduction of arterial blood flow at rest. Left ABI indicates moderate to severe redcution of arterial blood flow at rest.  Left femoral to below-knee popliteal bypass with reversed great saphenous vein by Dr Arbie Cookey 09-17-15   DVT Prophylaxis  :    Heparin   Lab Results  Component Value Date   PLT 369 09/19/2015    Inpatient Medications  Scheduled Meds: . aspirin  81 mg Oral Daily  . atorvastatin  20 mg Oral q1800  . docusate sodium  200 mg Oral BID  . doxycycline (VIBRAMYCIN) IV  100 mg Intravenous BID  . heparin subcutaneous  5,000 Units Subcutaneous Q8H  . lisinopril  10 mg Oral Daily  . metoprolol succinate  25 mg Oral Daily  . pantoprazole  40 mg Oral Daily  . polyethylene glycol  17 g Oral BID   Continuous Infusions:  PRN Meds:.sodium chloride, acetaminophen **OR** [DISCONTINUED] acetaminophen, albuterol, alum & mag hydroxide-simeth, bisacodyl, guaiFENesin-dextromethorphan, hydrALAZINE,  magnesium sulfate 1 - 4 g bolus IVPB, metoprolol, morphine injection, [DISCONTINUED] ondansetron **OR** ondansetron (ZOFRAN) IV, oxyCODONE, oxyCODONE-acetaminophen, phenol, potassium chloride, senna-docusate  Antibiotics  :    Anti-infectives    Start     Dose/Rate Route Frequency Ordered Stop   09/17/15 1930  cefUROXime (ZINACEF) 1.5 g in dextrose 5 % 50 mL IVPB     1.5 g 100 mL/hr over 30 Minutes Intravenous Every 12 hours 09/17/15 1230 09/18/15 0719   09/17/15 0600  cefUROXime (ZINACEF) 1.5 g in dextrose 5 % 50 mL IVPB     1.5 g 100 mL/hr over 30 Minutes Intravenous To [facility] 09/16/15 0810 09/17/15 0804   09/16/15 0030  doxycycline (VIBRAMYCIN) 100 mg in dextrose 5 % 250 mL IVPB     100 mg 125 mL/hr over 120 Minutes Intravenous 2 times daily 09/15/15 2343           Objective:   Vitals:   09/18/15 1800 09/18/15 1955 09/19/15 0003 09/19/15 0314  BP: (!) 107/45 (!) 109/47 (!) 108/47 (!) 110/53  Pulse:  74 66 66  Resp: 17 15 17 14   Temp:  98.3 F (36.8 C) 97.6 F (36.4 C) 98 F (36.7 C)  TempSrc:  Oral Oral Oral  SpO2:  100% 100% 99%  Weight:      Height:        Wt Readings from Last 3 Encounters:  09/16/15 48.5 kg (107 lb)  11/28/11 63 kg (139 lb)  10/15/11 72 kg (158 lb 11.7 oz)     Intake/Output Summary (Last 24 hours) at 09/19/15 0905 Last data filed at 09/19/15 0500  Gross per 24 hour  Intake             1080 ml  Output             1600 ml  Net             -  520 ml     Physical Exam  Awake Alert, Oriented X 3, No new F.N deficits, Normal affect [place].AT,PERRAL Supple Neck,No JVD, No cervical lymphadenopathy appriciated.  Symmetrical Chest wall movement, Good air movement bilaterally, CTAB RRR,No Gallops,Rubs or new Murmurs, No Parasternal Heave +ve B.Sounds, Abd Soft, No tenderness, No organomegaly appriciated, No rebound - guarding or rigidity. No Cyanosis, Clubbing or edema, No new Rash or bruise  Both lower extremities have signs of chronic  ischemia, bilateral lower extremity digits show signs of ischemia with left leg having ulcer on the dorsal aspect of the foot which is irregular in shape and about 3 cm in diameter, Both feet and a bandage, left leg femoropopliteal bypass site under bandage and no surrounding infection or cellulitis    Data Review:    CBC  Recent Labs Lab 09/16/15 0020 09/17/15 0514 09/18/15 0427 09/19/15 0439  WBC 8.7 9.5 8.9 9.8  HGB 10.3* 10.1* 9.1* 9.1*  HCT 31.5* 30.7* 28.2* 28.3*  PLT 398 395 366 369  MCV 92.1 91.6 92.5 91.9  MCH 30.1 30.1 29.8 29.5  MCHC 32.7 32.9 32.3 32.2  RDW 16.6* 16.2* 16.1* 15.9*  LYMPHSABS 1.5  --   --   --   MONOABS 0.7  --   --   --   EOSABS 0.8*  --   --   --   BASOSABS 0.1  --   --   --     Chemistries   Recent Labs Lab 09/16/15 0020 09/17/15 0514 09/18/15 0427 09/19/15 0439  NA 134* 132* 133* 133*  K 4.4 4.2 4.1 3.8  CL 103 98* 101 101  CO2 25 26 26 26   GLUCOSE 92 81 85 89  BUN 13 10 11 19   CREATININE 1.01 1.03 1.05 1.15  CALCIUM 8.4* 8.6* 8.4* 8.7*  AST 19  --   --   --   ALT 12*  --   --   --   ALKPHOS 70  --   --   --   BILITOT 0.4  --   --   --    ------------------------------------------------------------------------------------------------------------------ No results for input(s): CHOL, HDL, LDLCALC, TRIG, CHOLHDL, LDLDIRECT in the last 72 hours.  No results found for: HGBA1C ------------------------------------------------------------------------------------------------------------------ No results for input(s): TSH, T4TOTAL, T3FREE, THYROIDAB in the last 72 hours.  Invalid input(s): FREET3 ------------------------------------------------------------------------------------------------------------------ No results for input(s): VITAMINB12, FOLATE, FERRITIN, TIBC, IRON, RETICCTPCT in the last 72 hours.  Coagulation profile  Recent Labs Lab 09/17/15 0514  INR 1.10    No results for input(s): DDIMER in the last 72  hours.  Cardiac Enzymes No results for input(s): CKMB, TROPONINI, MYOGLOBIN in the last 168 hours.  Invalid input(s): CK ------------------------------------------------------------------------------------------------------------------ No results found for: BNP  Micro Results No results found for this or any previous visit (from the past 240 hour(s)).  Radiology Reports No results found.  Time Spent in minutes  30   SINGH,PRASHANT K M.D on 09/19/2015 at 9:05 AM  Between 7am to 7pm - Pager - [phone]  After 7pm go to [email or web address] - password [facility]  [facility] -  Office  [phone]

## 2015-09-19 NOTE — Evaluation (Signed)
Physical Therapy Evaluation Patient Details Name: Nathaniel Curtis MRN: [id number] DOB: 01/17/1932 Today's Date: 09/19/2015   History of Present Illness  Patient is an 82 yo male admitted 09/15/15 with non-healing wounds on his feet.  Patient with significant PVD.  Now s/p Lt fem-pop BPG on 09/17/15.    PMH:  CHF, EF 35%, PVD, bypass graft in 2013, CAD  Clinical Impression  Patient presents with problems listed below.  Will benefit from acute PT to maximize functional independence prior to discharge home.  At this time, do not anticipate any f/u PT needs at discharge.  Will continue to assess.    Follow Up Recommendations No PT follow up;Supervision - Intermittent    Equipment Recommendations  None recommended by PT    Recommendations for Other Services       Precautions / Restrictions Precautions Precautions: Fall Restrictions Weight Bearing Restrictions: No      Mobility  Bed Mobility Overal bed mobility: Needs Assistance Bed Mobility: Supine to Sit     Supine to sit: Supervision     General bed mobility comments: Supervision for safety only.  Transfers Overall transfer level: Needs assistance Equipment used: Rolling walker (2 wheeled) Transfers: Sit to/from Stand Sit to Stand: Min guard         General transfer comment: Assist for safety/balance during transfers.  Cues for hand placement.  Ambulation/Gait Ambulation/Gait assistance: Min guard Ambulation Distance (Feet): 50 Feet Assistive device: Rolling walker (2 wheeled) Gait Pattern/deviations: Step-through pattern;Decreased step length - right;Decreased step length - left;Decreased stride length;Shuffle;Antalgic;Trunk flexed Gait velocity: decreased Gait velocity interpretation: Below normal speed for age/gender General Gait Details: Verbal cues for safe use of RW.  Patient with slightly unsteady gait, with short shuffling steps.    Stairs            Wheelchair Mobility    Modified Rankin (Stroke  Patients Only)       Balance Overall balance assessment: Needs assistance         Standing balance support: Single extremity supported Standing balance-Leahy Scale: Poor                               Pertinent Vitals/Pain Pain Assessment: 0-10 Pain Score: 8  Pain Location: LLE Pain Descriptors / Indicators: Sore Pain Intervention(s): Monitored during session;Repositioned    Home Living Family/patient expects to be discharged to:: Private residence Living Arrangements: Alone Available Help at Discharge: Neighbor;Available PRN/intermittently Type of Home: House Home Access: Level entry     Home Layout: One level Home Equipment: Walker - 2 wheels;Shower seat;Bedside commode      Prior Function Level of Independence: Independent               Hand Dominance        Extremity/Trunk Assessment   Upper Extremity Assessment: RUE deficits/detail;LUE deficits/detail RUE Deficits / Details: Significant arthritic changes in hands and elbow     LUE Deficits / Details: Significant arthritic changes in hands and elbow   Lower Extremity Assessment: Generalized weakness (Feet bandaged)         Communication   Communication: No difficulties  Cognition Arousal/Alertness: Awake/alert Behavior During Therapy: WFL for tasks assessed/performed;Restless Overall Cognitive Status: Within Functional Limits for tasks assessed (Decreased safety awareness - ? baseline)                      General Comments      Exercises  Assessment/Plan    PT Assessment Patient needs continued PT services  PT Diagnosis Difficulty walking;Abnormality of gait;Generalized weakness;Acute pain   PT Problem List Decreased strength;Decreased activity tolerance;Decreased balance;Decreased mobility;Decreased knowledge of use of DME;Decreased safety awareness;Decreased skin integrity;Pain  PT Treatment Interventions DME instruction;Gait training;Functional mobility  training;Therapeutic activities;Therapeutic exercise;Patient/family education   PT Goals (Current goals can be found in the Care Plan section) Acute Rehab PT Goals Patient Stated Goal: None stated PT Goal Formulation: With patient Time For Goal Achievement: 09/26/15 Potential to Achieve Goals: Good    Frequency Min 3X/week   Barriers to discharge Decreased caregiver support Patient lives alone    Co-evaluation               End of Session Equipment Utilized During Treatment: Gait belt Activity Tolerance: Patient tolerated treatment well;Patient limited by pain Patient left: in bed;with call bell/phone within reach (sitting EOB for dinner) Nurse Communication: Mobility status         Time: 3546-5681 PT Time Calculation (min) (ACUTE ONLY): 18 min   Charges:   PT Evaluation $PT Eval Moderate Complexity: 1 Procedure     PT G CodesVena Austria 2015-10-14, 8:13 PM Durenda Hurt. Renaldo Fiddler, [facility] Acute Rehab Services Pager [phone]

## 2015-09-19 NOTE — Progress Notes (Signed)
    Subjective  - POD #2  Pain in left leg is better   Physical Exam:  Dressing from surgery removed.  All incisions healing appropriately.  Excellent doppler signals in right leg  Bilateral foot ulcer dressings changed.  Left leg ulcer is granulating nicely.  Left foot is stable       Assessment/Plan:  POD #2  Recovering nicely from left leg bypass.  Ulcers are improving on left and stable on right.  Continue with dressing changes to right leg  OK to transfer to floor  Will address right leg once he has recovered from left leg bypass  [place], [place] 09/19/2015 9:50 AM --  Vitals:   09/19/15 0314 09/19/15 0700  BP: (!) 110/53   Pulse: 66   Resp: 14   Temp: 98 F (36.7 C) 97.8 F (36.6 C)    Intake/Output Summary (Last 24 hours) at 09/19/15 0950 Last data filed at 09/19/15 0500  Gross per 24 hour  Intake              600 ml  Output             1450 ml  Net             -850 ml     Laboratory CBC    Component Value Date/Time   WBC 9.8 09/19/2015 0439   HGB 9.1 (L) 09/19/2015 0439   HCT 28.3 (L) 09/19/2015 0439   PLT 369 09/19/2015 0439    BMET    Component Value Date/Time   NA 133 (L) 09/19/2015 0439   K 3.8 09/19/2015 0439   CL 101 09/19/2015 0439   CO2 26 09/19/2015 0439   GLUCOSE 89 09/19/2015 0439   BUN 19 09/19/2015 0439   CREATININE 1.15 09/19/2015 0439   CALCIUM 8.7 (L) 09/19/2015 0439   GFRNONAA 58 (L) 09/19/2015 0439   GFRAA >60 09/19/2015 0439    COAG Lab Results  Component Value Date   INR 1.10 09/17/2015   INR 1.41 10/13/2011   INR 1.10 10/10/2011   No results found for: PTT  Antibiotics Anti-infectives    Start     Dose/Rate Route Frequency Ordered Stop   09/17/15 1930  cefUROXime (ZINACEF) 1.5 g in dextrose 5 % 50 mL IVPB     1.5 g 100 mL/hr over 30 Minutes Intravenous Every 12 hours 09/17/15 1230 09/18/15 0719   09/17/15 0600  cefUROXime (ZINACEF) 1.5 g in dextrose 5 % 50 mL IVPB     1.5 g 100 mL/hr over 30  Minutes Intravenous To [facility]Stay Surgical 09/16/15 0810 09/17/15 0804   09/16/15 0030  doxycycline (VIBRAMYCIN) 100 mg in dextrose 5 % 250 mL IVPB     100 mg 125 mL/hr over 120 Minutes Intravenous 2 times daily 09/15/15 2343         V. Charlena Cross, M.D. [facility] Office: [phone] Pager:  [phone]

## 2015-09-20 ENCOUNTER — Encounter (HOSPITAL_COMMUNITY): Payer: Medicare HMO

## 2015-09-20 ENCOUNTER — Encounter (HOSPITAL_COMMUNITY): Payer: Self-pay | Admitting: Vascular Surgery

## 2015-09-20 ENCOUNTER — Inpatient Hospital Stay (HOSPITAL_COMMUNITY): Payer: Medicare HMO

## 2015-09-20 DIAGNOSIS — Z9889 Other specified postprocedural states: Secondary | ICD-10-CM

## 2015-09-20 MED ORDER — METOPROLOL SUCCINATE ER 25 MG PO TB24: 25.0000 mg | ORAL_TABLET | Freq: Every day | ORAL | 0 refills | Status: AC

## 2015-09-20 MED ORDER — ASPIRIN 81 MG PO CHEW: 81.0000 mg | CHEWABLE_TABLET | Freq: Every day | ORAL | 0 refills | Status: AC

## 2015-09-20 MED ORDER — ATORVASTATIN CALCIUM 20 MG PO TABS: 20.0000 mg | ORAL_TABLET | Freq: Every day | ORAL | 0 refills | Status: AC

## 2015-09-20 MED ORDER — DOCUSATE SODIUM 100 MG PO CAPS: 100.0000 mg | ORAL_CAPSULE | Freq: Two times a day (BID) | ORAL | 0 refills | Status: AC

## 2015-09-20 MED ORDER — LISINOPRIL 10 MG PO TABS: 10.0000 mg | ORAL_TABLET | Freq: Every day | ORAL | 0 refills | Status: AC

## 2015-09-20 MED ORDER — OXYCODONE-ACETAMINOPHEN 5-325 MG PO TABS: 1.0000 | ORAL_TABLET | ORAL | 0 refills | Status: DC | PRN

## 2015-09-20 NOTE — Care Management Important Message (Signed)
Important Message  Patient Details  Name: Nathaniel Curtis MRN: [id number] Date of Birth: 09-May-1931   Medicare Important Message Given:  Yes    Samella Lucchetti Stefan Church 09/20/2015, 12:18 PM

## 2015-09-20 NOTE — Discharge Summary (Addendum)
Nathaniel Curtis YYT:[id number] DOB: August 24, 1931 DOA: 09/15/2015  PCP: Wilmer Floor., MD  Admit date: 09/15/2015  Discharge date: 09/20/2015  Admitted From: Home   Disposition:  Home   Recommendations for Outpatient Follow-up:   Follow up with PCP in 1-2 weeks  PCP Please obtain BMP/CBC, 2 view CXR in 1week,  (see Discharge instructions)   PCP Please follow up on the following pending results: None   Home Health: PT,RN Equipment/Devices: None Consultations: VVS, Cards Discharge Condition: Stable   CODE STATUS: Full   Diet Recommendation: Heart Healthy 1.5lit/day fluid restriction   No chief complaint on file.    Brief history of present illness from the day of admission and additional interim summary    Nathaniel Curtis a 82 y.o.malewith history of diastolic CHF last EF measured was 55-60% in 2013, peripheral vascular disease status post femoral bypass in 2013 by Dr. Arbie Cookey had presented to the ER at [facility] with nonhealing ulcers of his lower extremity on his foot and toes with worsening pain. CT angiographically done shows near occlusion of left SFA and right SFA with proximal popliteal reconstitution. Since patient has worsening pain and nonhealing ulcers and patient is known to Dr. Arbie Cookey patient is being transferred to [facility] for further management. On exam patient has multiple ulcers on the left foot distal toes and also on the right dorsum of his foot. There is mild discharge. X-rays done at [facility] did not show any definite osteomyelitis.  ED Course:Was a direct admit.  Hospital issues addressed     1. Bilateral lower extremity pain with bilateral foot ulcers due to bilateral SFA PAD. L > R  Dr. Arbie Cookey vascular surgeon on board, currently no signs of sepsis or severe  infection, placed on aspirin and Lipitor, he is post left femoropopliteal bypass on 09/17/2015, seems to have tolerated the procedure well. Appreciate cardiology and vascular surgery input. Minimal perioperative blood loss and dilution related anemia not requiring transfusion.Discussed his case with Dr. early on 09/20/2015 stable for discharge with home RN for dressing changes with outpatient follow-up with him.   2. History of CAD and chronic mine diastolic and systolic heart failure EF now 27%. Currently appears compensated. Currently chest pain-free, placed on Beta blocker, ACE inhibitor, aspirin and statin for secondary prevention. Been seen by cardiology will follow-up with them in 1-2 weeks after discharge.  3. History of smoking. Counseled to quit, says he quit alcohol several years ago.  4. Questionable ARF at [facility]. Creatinine normal here. We will monitor.  5. Constipation. Placed on bowel regimen with good results.    Discharge diagnosis     Principal Problem:   Skin ulcers of foot, bilateral (HCC) Active Problems:   PVD (peripheral vascular disease) with claudication (HCC)   CHF, chronic (HCC)   Foot ulcer (HCC)    Discharge instructions    Discharge Instructions    Discharge instructions    Complete by:  As directed   Follow with Primary MD CAMPBELL, STEPHEN D., MD in 7 days  Get CBC, CMP, 2 view Chest X ray checked  by Primary MD or SNF MD in 5-7 days ( we routinely change or add medications that can affect your baseline labs and fluid status, therefore we recommend that you get the mentioned basic workup next visit with your PCP, your PCP may decide not to get them or add new tests based on their clinical decision)   Activity: As tolerated with Full fall precautions use walker/cane & assistance as needed   Disposition Home     Diet:   Heart Healthy  Check your Weight same time everyday, if you gain over 2 pounds, or you develop in leg swelling,  experience more shortness of breath or chest pain, call your Primary MD immediately. Follow Cardiac Low Salt Diet and 1.5 lit/day fluid restriction.   On your next visit with your primary care physician please Get Medicines reviewed and adjusted.   Please request your Prim.MD to go over all Hospital Tests and Procedure/Radiological results at the follow up, please get all Hospital records sent to your Prim MD by signing hospital release before you go home.   If you experience worsening of your admission symptoms, develop shortness of breath, life threatening emergency, suicidal or homicidal thoughts you must seek medical attention immediately by calling 911 or calling your MD immediately  if symptoms less severe.  You Must read complete instructions/literature along with all the possible adverse reactions/side effects for all the Medicines you take and that have been prescribed to you. Take any new Medicines after you have completely understood and accpet all the possible adverse reactions/side effects.   Do not drive, operate heavy machinery, perform activities at heights, swimming or participation in water activities or provide baby sitting services if your were admitted for syncope or siezures until you have seen by Primary MD or a Neurologist and advised to do so again.  Do not drive when taking Pain medications.    Do not take more than prescribed Pain, Sleep and Anxiety Medications  Special Instructions: If you have smoked or chewed Tobacco  in the last 2 yrs please stop smoking, stop any regular Alcohol  and or any Recreational drug use.  Wear Seat belts while driving.   Please note  You were cared for by a hospitalist during your hospital stay. If you have any questions about your discharge medications or the care you received while you were in the hospital after you are discharged, you can call the unit and asked to speak with the hospitalist on call if the hospitalist that took  care of you is not available. Once you are discharged, your primary care physician will handle any further medical issues. Please note that NO REFILLS for any discharge medications will be authorized once you are discharged, as it is imperative that you return to your primary care physician (or establish a relationship with a primary care physician if you do not have one) for your aftercare needs so that they can reassess your need for medications and monitor your lab values.   Increase activity slowly    Complete by:  As directed      Discharge Medications     Medication List    STOP taking these medications   BC HEADACHE PO     TAKE these medications   aspirin 81 MG chewable tablet Chew 1 tablet (81 mg total) by mouth daily.   atorvastatin 20 MG tablet Commonly known as:  LIPITOR Take 1 tablet (20 mg total)  by mouth daily at 6 PM. Can be switched to any other statin at same dose   docusate sodium 100 MG capsule Commonly known as:  COLACE Take 1 capsule (100 mg total) by mouth 2 (two) times daily.   lisinopril 10 MG tablet Commonly known as:  PRINIVIL,ZESTRIL Take 1 tablet (10 mg total) by mouth daily.   metoprolol succinate 25 MG 24 hr tablet Commonly known as:  TOPROL-XL Take 1 tablet (25 mg total) by mouth daily.   nitroGLYCERIN 0.4 MG SL tablet Commonly known as:  NITROSTAT Place 0.4 mg under the tongue every 5 (five) minutes as needed. As needed for chest pain.   oxyCODONE-acetaminophen 5-325 MG tablet Commonly known as:  PERCOCET/ROXICET Take 1-2 tablets by mouth every 4 (four) hours as needed.   traMADol 50 MG tablet Commonly known as:  ULTRAM Take 1 tablet (50 mg total) by mouth every 6 (six) hours as needed for pain.       Follow-up Information    CAMPBELL, STEPHEN D., MD. Schedule an appointment as soon as possible for a visit in 1 week(s).   Specialty:  Internal Medicine Contact information: [address] [place]  [address] [phone]        Gretta Began, MD. Schedule an appointment as soon as possible for a visit in 1 week(s).   Specialties:  Vascular Surgery, Cardiology Contact information: [address] [place] [address] [phone]        Kristeen Miss, MD. Schedule an appointment as soon as possible for a visit in 1 week(s).   Specialty:  Cardiology Contact information: [address] [place] [address] [phone]           Major procedures and Radiology Reports - PLEASE review detailed and final reports thoroughly  -     TTE - Right ABI indicates severe reduction of arterial blood flow at rest. Left ABI indicates moderate to severe redcution of arterial blood flow at rest.  Left femoral to below-knee popliteal bypass with reversed great saphenous vein by Dr Arbie Cookey 09-17-15  ABI  RIGHT LEFT   ABI 0.53 0.98   Right ABI indicate a moderate to severe reduction in arterial flow with abnormal Doppler waveforms. Left ABI indicates normal arterial flow however abnormal Doppler waveforms may suggest a false elevation of pressures.    No results found.  Micro Results     No results found for this or any previous visit (from the past 240 hour(s)).  Today   Subjective    Nathaniel Curtis today has no headache,no chest abdominal pain,no new weakness tingling or numbness, feels much better wants to go home today.     Objective   Blood pressure (!) 115/52, pulse 72, temperature 99 F (37.2 C), temperature source Oral, resp. rate 18, height 5\' 9"  (1.753 m), weight 57.2 kg (126 lb), SpO2 96 %.   Intake/Output Summary (Last 24 hours) at 09/20/15 1030 Last data filed at 09/20/15 0900  Gross per 24 hour  Intake              960 ml  Output             1350 ml  Net             -390 ml    Exam Awake Alert, Oriented x 3, No new F.N deficits, Normal affect [place].AT,PERRAL Supple Neck,No JVD, No cervical lymphadenopathy appriciated.  Symmetrical Chest  wall movement, Good air movement bilaterally, CTAB  RRR,No Gallops,Rubs or new Murmurs, No Parasternal Heave +ve B.Sounds, Abd Soft, Non tender, No organomegaly appriciated, No rebound -guarding or rigidity. No Cyanosis, Clubbing or edema, No new Rash or bruise, L.Leg femoropopliteal bypass site stable and a bandage, chronic ischemic changes in both feet and toes   Data Review   CBC w Diff:  Lab Results  Component Value Date   WBC 9.8 09/19/2015   HGB 9.1 (L) 09/19/2015   HCT 28.3 (L) 09/19/2015   PLT 369 09/19/2015   LYMPHOPCT 18 09/16/2015   MONOPCT 8 09/16/2015   EOSPCT 10 09/16/2015   BASOPCT 1 09/16/2015    CMP:  Lab Results  Component Value Date   NA 133 (L) 09/19/2015   K 3.8 09/19/2015   CL 101 09/19/2015   CO2 26 09/19/2015   BUN 19 09/19/2015   CREATININE 1.15 09/19/2015   PROT 6.0 (L) 09/16/2015   ALBUMIN 2.5 (L) 09/16/2015   BILITOT 0.4 09/16/2015   ALKPHOS 70 09/16/2015   AST 19 09/16/2015   ALT 12 (L) 09/16/2015  .   Total Time in preparing paper work, data evaluation and todays exam - 35 minutes  Leroy Sea M.D on 09/20/2015 at 10:30 AM  [facility]   Office  [phone]

## 2015-09-20 NOTE — Progress Notes (Addendum)
Progress Note    09/20/2015 11:29 AM 3 Days Post-Op  Subjective:  Ready to go home.  Not much pain  Tm 99.6 now 99 VSS  Vitals:   09/20/15 0522 09/20/15 0925  BP: (!) 111/46 (!) 115/52  Pulse: 63 72  Resp: 18 18  Temp: 99.6 F (37.6 C) 99 F (37.2 C)    Physical Exam: Lungs:  Non labored Incisions:  All incisions are healing nicely Extremities:  + palpable left popliteal pulse; dressings on feet not taken down today-were inspected by vascular yesterday and left leg ulcer was granulating nicely.   CBC    Component Value Date/Time   WBC 9.8 09/19/2015 0439   RBC 3.08 (L) 09/19/2015 0439   HGB 9.1 (L) 09/19/2015 0439   HCT 28.3 (L) 09/19/2015 0439   PLT 369 09/19/2015 0439   MCV 91.9 09/19/2015 0439   MCH 29.5 09/19/2015 0439   MCHC 32.2 09/19/2015 0439   RDW 15.9 (H) 09/19/2015 0439   LYMPHSABS 1.5 09/16/2015 0020   MONOABS 0.7 09/16/2015 0020   EOSABS 0.8 (H) 09/16/2015 0020   BASOSABS 0.1 09/16/2015 0020    BMET    Component Value Date/Time   NA 133 (L) 09/19/2015 0439   K 3.8 09/19/2015 0439   CL 101 09/19/2015 0439   CO2 26 09/19/2015 0439   GLUCOSE 89 09/19/2015 0439   BUN 19 09/19/2015 0439   CREATININE 1.15 09/19/2015 0439   CALCIUM 8.7 (L) 09/19/2015 0439   GFRNONAA 58 (L) 09/19/2015 0439   GFRAA >60 09/19/2015 0439    INR    Component Value Date/Time   INR 1.10 09/17/2015 0514     Intake/Output Summary (Last 24 hours) at 09/20/15 1129 Last data filed at 09/20/15 0900  Gross per 24 hour  Intake              960 ml  Output             1000 ml  Net              -40 ml   ABI's 09/20/15:  RIGHT   LEFT    PRESSURE WAVEFORM  PRESSURE WAVEFORM  Brachial 94 Triphasic Brachial 113 Triphasic  AT 38  Dampened Monophasic AT 76 Dampened Monophasic  PT 60 Monophasic PT 111 Monophasic    RIGHT LEFT  ABI 0.53 0.98     Assessment:  82 y.o. male is s/p:  Left femoral to below-knee popliteal bypass with reversed great saphenous vein    3 Days Post-Op  Plan: -pt doing well today with palpable left popliteal pulse -ABI significantly improved from pre op at 0.98 (up from 0.64) -will continue to watch right foot ulcers while he heals from from left leg bypass.  May eventually need right leg bypass. -pt being discharged home today-will need HHRN for dressing changes on feet. -f/u with Dr. Arbie Cookey in 2 weeks.     Doreatha Massed, PA-C [facility] [phone] 09/20/2015 11:29 AM   - For VQI Registry use -   Post-op:  Wound infection: No  Graft infection: No  Transfusion: No  If yes, n/a units given New Arrhythmia: No Ipsilateral amputation: [x ] no, [ ]  Minor, [ ]  BKA, [ ]  AKA Discharge patency: [x ] Primary, [ ]  Primary assisted, [ ]  Secondary, [ ]  Occluded Patency judged by: [ ]  Dopper only, [x ] Palpable graft pulse, [ ]  Palpable distal pulse, [ ]  ABI inc. > 0.15, [ ]  Duplex Discharge ABI:  R 0.53, L 0.98 Discharge TBI: R not done, L  D/C Ambulatory Status: Ambulatory  Complications: MI: [ ]  No, [ ]  Troponin only, [ ]  EKG or Clinical CHF: No Resp failure: [x]  none, [ ]  Pneumonia, [ ]  Ventilator Chg in renal function: [x ] none, [ ]  Inc. Cr > 0.5, [ ]  Temp. Dialysis, [ ]  Permanent dialysis Stroke: [x ] None, [ ]  Minor, [ ]  Major Return to OR: No  Reason for return to OR: [ ]  Bleeding, [ ]  Infection, [ ]  Thrombosis, [ ]  Revision  Discharge medications: Statin use:  Yes ASA use:  Yes Plavix use:  No Beta blocker use: Yes Coumadin use: No ACEI use:  Yes  I have examined the patient, reviewed and agree with above.  , MD 09/20/2015 12:39 PM

## 2015-09-20 NOTE — Progress Notes (Signed)
VASCULAR LAB PRELIMINARY  ARTERIAL  ABI completed:    RIGHT    LEFT    PRESSURE WAVEFORM  PRESSURE WAVEFORM  Brachial 94 Triphasic Brachial 113 Triphasic  AT 38  Dampened Monophasic AT 76 Dampened Monophasic  PT 60 Monophasic PT 111 Monophasic    RIGHT LEFT  ABI 0.53 0.98   Right ABI indicate a moderate to severe reduction in arterial flow with abnormal Doppler waveforms. Left ABI indicates normal arterial flow however abnormal Doppler waveforms may suggest a false elevation of pressures.  Leela Vanbrocklin, RVS 09/20/2015, 9:36 AM

## 2015-09-20 NOTE — Discharge Instructions (Signed)
Follow with Primary MD CAMPBELL, STEPHEN D., MD in 7 days   Get CBC, CMP, 2 view Chest X ray checked  by Primary MD or SNF MD in 5-7 days ( we routinely change or add medications that can affect your baseline labs and fluid status, therefore we recommend that you get the mentioned basic workup next visit with your PCP, your PCP may decide not to get them or add new tests based on their clinical decision)   Activity: As tolerated with Full fall precautions use walker/cane & assistance as needed   Disposition Home     Diet:   Heart Healthy  Check your Weight same time everyday, if you gain over 2 pounds, or you develop in leg swelling, experience more shortness of breath or chest pain, call your Primary MD immediately. Follow Cardiac Low Salt Diet and 1.5 lit/day fluid restriction.   On your next visit with your primary care physician please Get Medicines reviewed and adjusted.   Please request your Prim.MD to go over all Hospital Tests and Procedure/Radiological results at the follow up, please get all Hospital records sent to your Prim MD by signing hospital release before you go home.   If you experience worsening of your admission symptoms, develop shortness of breath, life threatening emergency, suicidal or homicidal thoughts you must seek medical attention immediately by calling 911 or calling your MD immediately  if symptoms less severe.  You Must read complete instructions/literature along with all the possible adverse reactions/side effects for all the Medicines you take and that have been prescribed to you. Take any new Medicines after you have completely understood and accpet all the possible adverse reactions/side effects.   Do not drive, operate heavy machinery, perform activities at heights, swimming or participation in water activities or provide baby sitting services if your were admitted for syncope or siezures until you have seen by Primary MD or a Neurologist and advised to  do so again.  Do not drive when taking Pain medications.    Do not take more than prescribed Pain, Sleep and Anxiety Medications  Special Instructions: If you have smoked or chewed Tobacco  in the last 2 yrs please stop smoking, stop any regular Alcohol  and or any Recreational drug use.  Wear Seat belts while driving.   Please note  You were cared for by a hospitalist during your hospital stay. If you have any questions about your discharge medications or the care you received while you were in the hospital after you are discharged, you can call the unit and asked to speak with the hospitalist on call if the hospitalist that took care of you is not available. Once you are discharged, your primary care physician will handle any further medical issues. Please note that NO REFILLS for any discharge medications will be authorized once you are discharged, as it is imperative that you return to your primary care physician (or establish a relationship with a primary care physician if you do not have one) for your aftercare needs so that they can reassess your need for medications and monitor your lab values.

## 2015-09-20 NOTE — Progress Notes (Signed)
Physical Therapy Treatment Patient Details Name: Nathaniel Curtis MRN: [id number] DOB: Jul 09, 1931 Today's Date: 09/20/2015    History of Present Illness Patient is an 82 yo male admitted 09/15/15 with non-healing wounds on his feet.  Patient with significant PVD.  Now s/p Lt fem-pop BPG on 09/17/15.    PMH:  CHF, EF 35%, PVD, bypass graft in 2013, CAD    PT Comments    Continuing improvements in gait and activity tolerance; Managing household distance relatively well with RW; OK for dc home from PT standpoint   Follow Up Recommendations  No PT follow up;Supervision - Intermittent     Equipment Recommendations  None recommended by PT    Recommendations for Other Services       Precautions / Restrictions Precautions Precautions: Fall Precaution Comments: Fall risk greatly reduced with use of RW    Mobility  Bed Mobility   Bed Mobility: Supine to Sit     Supine to sit: Modified independent (Device/Increase time)        Transfers Overall transfer level: Needs assistance Equipment used: Rolling walker (2 wheeled) Transfers: Sit to/from Stand Sit to Stand: Supervision         General transfer comment: Supervision for safety; no need for physical assist  Ambulation/Gait Ambulation/Gait assistance: Supervision Ambulation Distance (Feet): 150 Feet Assistive device: Rolling walker (2 wheeled) Gait Pattern/deviations: Decreased step length - right;Decreased step length - left Gait velocity: decreased   General Gait Details: Verbal cues for safe use of RW.  Patient with slightly unsteady gait, with short shuffling steps.     Stairs            Wheelchair Mobility    Modified Rankin (Stroke Patients Only)       Balance             Standing balance-Leahy Scale: Poor                      Cognition Arousal/Alertness: Awake/alert Behavior During Therapy: WFL for tasks assessed/performed;Restless Overall Cognitive Status: Within Functional Limits  for tasks assessed (Decreased safety awareness - ? baseline)                      Exercises      General Comments        Pertinent Vitals/Pain Pain Assessment: Faces Faces Pain Scale: Hurts little more Pain Location: LLE; reports overall that pain has improved Pain Descriptors / Indicators: Aching Pain Intervention(s): Monitored during session    Home Living                      Prior Function            PT Goals (current goals can now be found in the care plan section) Acute Rehab PT Goals Patient Stated Goal: HOpes to go home today PT Goal Formulation: With patient Time For Goal Achievement: 09/26/15 Potential to Achieve Goals: Good Progress towards PT goals: Progressing toward goals    Frequency  Min 3X/week    PT Plan Current plan remains appropriate    Co-evaluation             End of Session Equipment Utilized During Treatment: Gait belt Activity Tolerance: Patient tolerated treatment well Patient left: in bed;with call bell/phone within reach (sitting EOB for lunch)     Time: 6286-3817 PT Time Calculation (min) (ACUTE ONLY): 13 min  Charges:  $Gait Training: 8-22 mins  G Codes:      Van Clines Hamff 09/20/2015, 2:07 PM   Van Clines, [place]  Acute Rehabilitation Services Pager [phone] Office [phone]

## 2015-09-20 NOTE — Evaluation (Signed)
Occupational Therapy Evaluation Patient Details Name: Nathaniel Curtis MRN: [id number] DOB: 1931-06-30 Today's Date: 09/20/2015    History of Present Illness Patient is an 82 yo male admitted 09/15/15 with non-healing wounds on his feet.  Patient with significant PVD.  Now s/p Lt fem-pop BPG on 09/17/15.    PMH:  CHF, EF 35%, PVD, bypass graft in 2013, CAD   Clinical Impression   Pt performing self care and mobility with RW at a modified independent level. No OT needs.    Follow Up Recommendations  No OT follow up    Equipment Recommendations  None recommended by OT    Recommendations for Other Services       Precautions / Restrictions Precautions Precautions: Fall Precaution Comments: Fall risk greatly reduced with use of RW Restrictions Weight Bearing Restrictions: No      Mobility Bed Mobility Overal bed mobility: Modified Independent      General bed mobility comments: no assist, HOB up 20*  Transfers Overall transfer level: Modified independent Equipment used: Rolling walker (2 wheeled) Transfers: Sit to/from Stand Sit to Stand: Supervision         General transfer comment: no assist, safely stood    Balance             Standing balance-Leahy Scale: Fair                              ADL Overall ADL's : Modified independent                                             Vision     Perception     Praxis      Pertinent Vitals/Pain Pain Assessment: No/denies pain Faces Pain Scale: Hurts little more Pain Location: LLE; reports overall that pain has improved Pain Descriptors / Indicators: Aching Pain Intervention(s): Monitored during session     Hand Dominance Right   Extremity/Trunk Assessment Upper Extremity Assessment Upper Extremity Assessment: RUE deficits/detail;LUE deficits/detail RUE Coordination: decreased fine motor LUE Deficits / Details: Significant arthritic changes in hands and elbow LUE  Coordination: decreased fine motor   Lower Extremity Assessment Lower Extremity Assessment: Defer to PT evaluation       Communication Communication Communication: No difficulties   Cognition Arousal/Alertness: Awake/alert Behavior During Therapy: WFL for tasks assessed/performed;Restless Overall Cognitive Status: Within Functional Limits for tasks assessed                     General Comments       Exercises       Shoulder Instructions      Home Living Family/patient expects to be discharged to:: Private residence Living Arrangements: Alone Available Help at Discharge: Neighbor;Available PRN/intermittently Type of Home: House Home Access: Level entry     Home Layout: One level     Bathroom Shower/Tub: Chief Strategy Officer: Standard     Home Equipment: Environmental consultant - 2 wheels;Shower seat;Bedside commode          Prior Functioning/Environment Level of Independence: Independent        Comments: pt sponge bathes    OT Diagnosis: Generalized weakness   OT Problem List:     OT Treatment/Interventions:      OT Goals(Current goals can be found in the care plan section) Acute Rehab OT  Goals Patient Stated Goal: home today  OT Frequency:     Barriers to D/C:            Co-evaluation              End of Session Equipment Utilized During Treatment: Rolling walker  Activity Tolerance: Patient tolerated treatment well Patient left: in bed;with call bell/phone within reach   Time: 9326-7124 OT Time Calculation (min): 14 min Charges:  OT General Charges $OT Visit: 1 Procedure OT Evaluation $OT Eval Low Complexity: 1 Procedure G-Codes:    Evern Bio 09/20/2015, 4:37 PM  [phone]

## 2015-09-20 NOTE — Care Management Note (Signed)
Case Management Note  Patient Details  Name: Nathaniel Curtis MRN: [id number] Date of Birth: [birth year]  Subjective/Objective:       CM following for progression and d/c planning.              Action/Plan: 09/20/2015 Met with pt re HH needs, noted order for [facility] however PT eval not recommending HHPT. Pt states that he does not wish to have PT services as he is ambulatory, can get around in his home, from room to room  And every thing is on one lever per pt . He states that he has a walker and has no other needs other than a way to get home. This CM discussed this need with social worker, Shelle Iron , who will assist the pt.   Expected Discharge Date:    09/20/2015              Expected Discharge Plan:  Home/Self Care  In-House Referral:  Clinical Social Work  Discharge planning Services  CM Consult  Post Acute Care Choice:  NA Choice offered to:  NA  DME Arranged:  N/A DME Agency:  NA  HH Arranged:  NA HH Agency:  NA  Status of Service:  Completed, signed off  If discussed at [employer] of Stay Meetings, dates discussed:    Additional Comments:  Adron Bene, RN 09/20/2015, 1:14 PM

## 2015-09-20 NOTE — Progress Notes (Signed)
PT Cancellation Note  Patient Details Name: Nathaniel Curtis MRN: [id number] DOB: Oct 25, 1931   Cancelled Treatment:    Reason Eval/Treat Not Completed: Patient at procedure or test/unavailable   Will follow up later today as time allows;  Otherwise, will follow up for PT tomorrow;   Thank you,  Van Clines, PT  Acute Rehabilitation Services Pager [phone] Office [phone]     Van Clines [facility] 09/20/2015, 8:55 AM

## 2015-09-22 ENCOUNTER — Telehealth: Payer: Self-pay

## 2015-09-22 ENCOUNTER — Telehealth: Payer: Self-pay | Admitting: Vascular Surgery

## 2015-09-22 NOTE — Telephone Encounter (Signed)
-----   Message from Jena Gauss sent at 09/22/2015 10:25 AM EDT ----- Regarding: pt question  This pt wants to know when their home health nurse is supposed to come to change his bandages.  The last I saw were orders for home health in the 09/20/15 progress notes from [facility].  His ph# is  [phone]  Thank you, Elon Jester

## 2015-09-22 NOTE — Telephone Encounter (Signed)
Sched appt 9/12 at 10:15. Spoke to pt to inform them of appt.

## 2015-09-22 NOTE — Telephone Encounter (Signed)
-----   Message from Sharee Pimple, RN sent at 09/20/2015  1:33 PM EDT ----- Regarding: 2 weeks postop   ----- Message ----- From: Dara Lords, PA-C Sent: 09/20/2015  11:32 AM To: Vvs Charge Pool  S/p left leg bypass with vein.  F/u with Dr. Arbie Cookey in 2 weeks.  Thanks, Lelon Mast

## 2015-09-22 NOTE — Telephone Encounter (Signed)
Attempted to call pt. In response to message re: dressing changes.  Left voice message to return call to office to speak to nurse.

## 2015-09-23 NOTE — Telephone Encounter (Signed)
Discussed with Dr. Arbie Cookey.  Per Dr. Arbie Cookey, he agreed that the patient needs Wet-dry NS dressings bid to bilateral feet.  Phone call to [facility].  Discussed order with Tiffany; to return call re: services requested.      RE: DRESSING ORDERS  Received: Today  Message Contents  Dara Lords, PA-C  Phillips Odor, RN        So, I asked Dr. Myra Gianotti bc he saw the pt on Sunday before discharge. He says wet to dry dressing changes bid on the top of both feet.  Thanks  (this is Dr. Bosie Helper pt and I only saw this pt once-may wanna make sure that is okay with him as well).   Thanks,  samantha   Previous Messages    ----- Message -----  From: Phillips Odor, RN  Sent: 09/22/2015  2:59 PM  To: Dara Lords, PA-C  Subject: DRESSING ORDERS                  This pt. Did not have HH RN set up at discharge. I am going to call the case manager; what type of dressing should be done to his feet? The progress note on 8/28 just says will need [facility] RN to do dressing changes to feet.

## 2015-09-24 NOTE — Telephone Encounter (Signed)
Called [facility]'s Intake Coordinator Lupita Leash) at [phone].  I will fax her the referral form to get someone in the patient's home ASAP.  Lupita Leash says once she verifies insurance, they may be able to come some time tomorrow (09/25/15).

## 2015-09-24 NOTE — Telephone Encounter (Signed)
Notified the pt. Re: plan for [facility] to come to his house tomorrow for dressing changes.  Verb. Understanding.

## 2015-09-28 ENCOUNTER — Telehealth: Payer: Self-pay | Admitting: Vascular Surgery

## 2015-09-28 NOTE — Telephone Encounter (Signed)
[facility] [phone]) to get patient set up for Home Health.  Fax numbers were not going through so we emailed patient's order to the Lupita Leash, the intake Coordinator.  Lupita Leash called back and said they will not do Home health for a simple wet to dry dressing change.  I asked Lupita Leash if they can go to the patient's home and teach him or a family member how to do it. Lupita Leash wants [place] to call the patient to make sure someone is in the home that is teachable and to inform him that his insurance may not cover this one-time service.  I called patient and left a voice message. Ashleilgh E., LPN.

## 2015-09-29 ENCOUNTER — Telehealth: Payer: Self-pay | Admitting: Vascular Surgery

## 2015-09-29 DIAGNOSIS — L97529 Non-pressure chronic ulcer of other part of left foot with unspecified severity: Principal | ICD-10-CM

## 2015-09-29 DIAGNOSIS — L97519 Non-pressure chronic ulcer of other part of right foot with unspecified severity: Secondary | ICD-10-CM

## 2015-09-29 NOTE — Telephone Encounter (Addendum)
I spoke to Mr. Nathaniel Curtis today.  I offered to work patient in for a follow up appt today and a dressing change/teaching.  He says he does not feel comfortable driving and would rather have someone come out to his home to help him.  Patient says he is wiilling to pay for a one-time home health visit if his insurance does not pay.  Spoke to Cody at [facility]. She will have someone come by and teach patient and make him aware that if insurance does not pay, his out of pocket cost will be $157.  Patient will follow up at our office in the next few weeks.  Ashleigh E. LPN.

## 2015-09-29 NOTE — Telephone Encounter (Signed)
Patient called and left a voice msg wanting a return phone call.  I called and left a message stating [facility] will be in contact with him today or tomorrow to set up a time to come by.  Deshonna Trnka E. LPN

## 2015-09-30 ENCOUNTER — Encounter: Payer: Self-pay | Admitting: Vascular Surgery

## 2015-09-30 NOTE — Telephone Encounter (Signed)
Lupita Leash from [facility] called again.  She said because the patient last saw his PCP in 2013, they cannot go by to see him due to company rules.  She did check with [facility] and they can make him appt to come in for observation this Mon, Wed, or Fri.  She will let patient know we are sending over the referral for Wound care. Janellie Tennison E. LPN.

## 2015-09-30 NOTE — Addendum Note (Signed)
Addended by: Yolonda Kida on: 09/30/2015 02:17 PM   Modules accepted: Orders

## 2015-10-03 ENCOUNTER — Emergency Department (HOSPITAL_COMMUNITY)
Admission: EM | Admit: 2015-10-03 | Discharge: 2015-10-03 | Disposition: A | Discharge status: Home / Self Care | Payer: Medicare HMO | Attending: Emergency Medicine | Admitting: Emergency Medicine

## 2015-10-03 ENCOUNTER — Emergency Department (HOSPITAL_COMMUNITY): Payer: Medicare HMO

## 2015-10-03 ENCOUNTER — Encounter (HOSPITAL_COMMUNITY): Payer: Self-pay

## 2015-10-03 DIAGNOSIS — F17210 Nicotine dependence, cigarettes, uncomplicated: Secondary | ICD-10-CM | POA: Diagnosis not present

## 2015-10-03 DIAGNOSIS — L03116 Cellulitis of left lower limb: Secondary | ICD-10-CM | POA: Diagnosis not present

## 2015-10-03 DIAGNOSIS — M79672 Pain in left foot: Secondary | ICD-10-CM | POA: Diagnosis present

## 2015-10-03 DIAGNOSIS — Z7982 Long term (current) use of aspirin: Secondary | ICD-10-CM | POA: Insufficient documentation

## 2015-10-03 DIAGNOSIS — I5032 Chronic diastolic (congestive) heart failure: Secondary | ICD-10-CM | POA: Diagnosis not present

## 2015-10-03 DIAGNOSIS — I110 Hypertensive heart disease with heart failure: Secondary | ICD-10-CM | POA: Diagnosis not present

## 2015-10-03 LAB — COMPREHENSIVE METABOLIC PANEL
ALBUMIN: 3.1 g/dL — AB (ref 3.5–5.0)
ALT: 18 U/L (ref 17–63)
ANION GAP: 12 (ref 5–15)
AST: 29 U/L (ref 15–41)
Alkaline Phosphatase: 101 U/L (ref 38–126)
BILIRUBIN TOTAL: 0.3 mg/dL (ref 0.3–1.2)
BUN: 31 mg/dL — ABNORMAL HIGH (ref 6–20)
CO2: 18 mmol/L — ABNORMAL LOW (ref 22–32)
Calcium: 8.8 mg/dL — ABNORMAL LOW (ref 8.9–10.3)
Chloride: 100 mmol/L — ABNORMAL LOW (ref 101–111)
Creatinine, Ser: 1.39 mg/dL — ABNORMAL HIGH (ref 0.61–1.24)
GFR, EST AFRICAN AMERICAN: 53 mL/min — AB (ref >60)
GFR, EST NON AFRICAN AMERICAN: 46 mL/min — AB (ref >60)
GLUCOSE: 71 mg/dL (ref 65–99)
POTASSIUM: 4.2 mmol/L (ref 3.5–5.1)
Sodium: 130 mmol/L — ABNORMAL LOW (ref 135–145)
TOTAL PROTEIN: 7.6 g/dL (ref 6.5–8.1)

## 2015-10-03 LAB — CBC WITH DIFFERENTIAL/PLATELET
BASOS ABS: 0.2 10*3/uL — AB (ref 0.0–0.1)
BASOS PCT: 1 %
EOS ABS: 0.4 10*3/uL (ref 0.0–0.7)
EOS PCT: 3 %
HCT: 34.8 % — ABNORMAL LOW (ref 39.0–52.0)
HEMOGLOBIN: 11.5 g/dL — AB (ref 13.0–17.0)
Lymphocytes Relative: 16 %
Lymphs Abs: 1.9 10*3/uL (ref 0.7–4.0)
MCH: 30.3 pg (ref 26.0–34.0)
MCHC: 33 g/dL (ref 30.0–36.0)
MCV: 91.6 fL (ref 78.0–100.0)
Monocytes Absolute: 0.5 10*3/uL (ref 0.1–1.0)
Monocytes Relative: 4 %
NEUTROS PCT: 76 %
Neutro Abs: 9 10*3/uL — ABNORMAL HIGH (ref 1.7–7.7)
PLATELETS: 567 10*3/uL — AB (ref 150–400)
RBC: 3.8 MIL/uL — AB (ref 4.22–5.81)
RDW: 16.1 % — ABNORMAL HIGH (ref 11.5–15.5)
WBC: 11.9 10*3/uL — AB (ref 4.0–10.5)

## 2015-10-03 LAB — I-STAT CG4 LACTIC ACID, ED
LACTIC ACID, VENOUS: 3.4 mmol/L — AB (ref 0.5–1.9)
LACTIC ACID, VENOUS: 3.13 mmol/L — AB (ref 0.5–1.9)

## 2015-10-03 MED ORDER — SODIUM CHLORIDE 0.9 % IV BOLUS (SEPSIS)
1000.0000 mL | Freq: Once | INTRAVENOUS | Status: AC
  Administered 2015-10-03: 1000 mL via INTRAVENOUS

## 2015-10-03 MED ORDER — MORPHINE SULFATE (PF) 2 MG/ML IV SOLN
2.0000 mg | Freq: Once | INTRAVENOUS | Status: AC
  Administered 2015-10-03: 2 mg via INTRAVENOUS
  Filled 2015-10-03: qty 1

## 2015-10-03 MED ORDER — CEPHALEXIN 500 MG PO CAPS: 500.0000 mg | ORAL_CAPSULE | Freq: Four times a day (QID) | ORAL | 0 refills | Status: AC

## 2015-10-03 MED ORDER — OXYCODONE-ACETAMINOPHEN 5-325 MG PO TABS
1.0000 | ORAL_TABLET | Freq: Once | ORAL | Status: AC
  Administered 2015-10-03: 1 via ORAL
  Filled 2015-10-03: qty 1

## 2015-10-03 MED ORDER — MORPHINE SULFATE (PF) 4 MG/ML IV SOLN
4.0000 mg | Freq: Once | INTRAVENOUS | Status: AC
  Administered 2015-10-03: 4 mg via INTRAVENOUS
  Filled 2015-10-03: qty 1

## 2015-10-03 NOTE — Discharge Instructions (Signed)
Please read and follow all provided instructions.  Your diagnoses today include:  1. Cellulitis of left lower extremity    Tests performed today include: Vital signs. See below for your results today.   Medications prescribed:   Take any prescribed medications only as directed.   Home care instructions:  Follow any educational materials contained in this packet. Keep affected area above the level of your heart when possible. Wash area gently twice a day with warm soapy water. Do not apply alcohol or hydrogen peroxide. Cover the area if it draining or weeping.   Follow-up instructions: Please follow-up with Dr. Arbie Cookey for further evaluation of your symptoms. His office will call you tomorrow morning to set up an appointment   Return instructions:  Return to the Emergency Department if you have: Fever Worsening symptoms Worsening pain Worsening swelling Redness of the skin that moves away from the affected area, especially if it streaks away from the affected area  Any other emergent concerns  Your vital signs today were: BP (!) 118/52    Pulse 66    Temp 97.9 F (36.6 C) (Oral)    Resp 20    SpO2 100%  If your blood pressure (BP) was elevated above 135/85 this visit, please have this repeated by your doctor within one month. --------------

## 2015-10-03 NOTE — ED Provider Notes (Signed)
[facility]-EMERGENCY DEPT Provider Note   CSN: [id number] Arrival date & time: 10/03/15  1711     History   Chief Complaint Chief Complaint  Patient presents with  . Leg Pain    HPI Nathaniel Curtis is a 82 y.o. male.  HPI  82 y.o. male with a hx of Diastolic CHF with EF 35%, PVD, presents to the Emergency Department today complaining of left foot pain since DC from hospital on 09-20-15. Noted left femoral-popliteal bypass w/ saphenous vein on 09-17-15 by Dr. Arbie Cookey. Pt notes that since his discahrgem he was not able to follow up with wound care at his home for dressing changes. States that he doesn't got the doctor unless there is a problem. Pt notes pain on foot and decided to come to the ED. Notes pain 10/10 and worse with ambulation. No fevers at home. No N/V/D. No CP/SOB/ABD pain. No headache. During his admission he was not shown to have osteomyelitis on imaging.  No other symptoms noted.    Past Medical History:  Diagnosis Date  . Arthritis   . CHF (congestive heart failure) (HCC)    Echo 09/26/11 - EF 50-55%, grade 1 diastolic dysfunction, no RWMAs, no significant valvular abnls  . ETOH abuse    70+ yrs, 3 glasses of brandy/night   . Glaucoma   . Heart attack (HCC) 2009   found on EKG, no cath  . Hyperlipidemia   . PVD (peripheral vascular disease) (HCC)    severe iliac disease  . Tobacco abuse    1ppd for 70+yrs    Patient Active Problem List   Diagnosis Date Noted  . Skin ulcers of foot, bilateral (HCC) 09/15/2015  . Foot ulcer (HCC) 09/15/2015  . PAD (peripheral artery disease) (HCC) 11/28/2011  . Pre-operative cardiovascular examination 09/27/2011  . PVD (peripheral vascular disease) with claudication (HCC) 09/24/2011  . Cellulitis of right foot 09/24/2011  . HTN (hypertension) 09/24/2011  . Hyperlipidemia 09/24/2011  . Tobacco abuse 09/24/2011  . CHF, chronic (HCC) 09/24/2011  . Alcohol abuse 09/24/2011    Past Surgical History:  Procedure Laterality Date  .  AORTA - BILATERAL FEMORAL ARTERY BYPASS GRAFT  10/13/2011   Procedure: AORTA BIFEMORAL BYPASS GRAFT;  Surgeon: Larina Earthly, MD;  Location: [facility] OR;  Service: Vascular;  Laterality: N/A;  Aortic bifemoral bypass with 47mm x 38mm graft.    Marland Kitchen CARDIAC CATHETERIZATION  10/2007  . CATARACT EXTRACTION W/ INTRAOCULAR LENS IMPLANT  RIGHT EYE 2009  . EYE SURGERY  02/09  . FEMORAL-POPLITEAL BYPASS GRAFT Left 09/17/2015   Procedure: LEFT FEMORAL-POPLITEAL ARTERY  BYPASS WITH SAPHENOUS VEIN;  Surgeon: Larina Earthly, MD;  Location: [facility] OR;  Service: Vascular;  Laterality: Left;  Marland Kitchen VEIN HARVEST Left 09/17/2015   Procedure: LEFT SAPHENOUS VEIN HARVEST;  Surgeon: Larina Earthly, MD;  Location: [facility] OR;  Service: Vascular;  Laterality: Left;     Home Medications    Prior to Admission medications   Medication Sig Start Date End Date Taking? Authorizing Provider  aspirin 81 MG chewable tablet Chew 1 tablet (81 mg total) by mouth daily. 09/20/15  Yes Leroy Sea, MD  atorvastatin (LIPITOR) 20 MG tablet Take 1 tablet (20 mg total) by mouth daily at 6 PM. Can be switched to any other statin at same dose 09/20/15  Yes Leroy Sea, MD  docusate sodium (COLACE) 100 MG capsule Take 1 capsule (100 mg total) by mouth 2 (two) times daily. 09/20/15  Yes Leroy Sea, MD  lisinopril (PRINIVIL,ZESTRIL) 10 MG tablet Take 1 tablet (10 mg total) by mouth daily. 09/20/15  Yes Leroy Sea, MD  metoprolol succinate (TOPROL-XL) 25 MG 24 hr tablet Take 1 tablet (25 mg total) by mouth daily. 09/20/15  Yes Leroy Sea, MD  nitroGLYCERIN (NITROSTAT) 0.4 MG SL tablet Place 0.4 mg under the tongue every 5 (five) minutes as needed. As needed for chest pain.   Yes Historical Provider, MD  oxyCODONE-acetaminophen (PERCOCET/ROXICET) 5-325 MG tablet Take 1-2 tablets by mouth every 4 (four) hours as needed. 09/20/15  Yes Leroy Sea, MD  traMADol (ULTRAM) 50 MG tablet Take 1 tablet (50 mg total) by mouth every 6 (six) hours as  needed for pain. Patient not taking: Reported on 09/15/2015 12/29/11   Larina Earthly, MD   Family History Family History  Problem Relation Age of Onset  . Heart disease Maternal Grandfather     MI 65yo    Social History Social History  Substance Use Topics  . Smoking status: Current Every Day Smoker    Packs/day: 1.00    Years: 70.00    Types: Cigarettes  . Smokeless tobacco: Former Neurosurgeon  . Alcohol use Yes     Comment: 70+ years, 3 glasses of brandy a night   Allergies   Lyrica [pregabalin]  Review of Systems Review of Systems ROS reviewed and all are negative for acute change except as noted in the HPI.  Physical Exam Updated Vital Signs BP 124/56   Pulse 66   Temp 97.9 F (36.6 C) (Oral)   Resp 20   SpO2 100%   Physical Exam  Constitutional: He is oriented to person, place, and time. Vital signs are normal. He appears well-developed and well-nourished.  HENT:  Head: Normocephalic.  Right Ear: Hearing normal.  Left Ear: Hearing normal.  Eyes: Conjunctivae and EOM are normal. Pupils are equal, round, and reactive to light.  Neck: Normal range of motion. Neck supple.  Cardiovascular: Normal rate, regular rhythm, normal heart sounds and intact distal pulses.   Pulmonary/Chest: Effort normal and breath sounds normal.  Abdominal: Soft.  Musculoskeletal: Normal range of motion.  Neurological: He is alert and oriented to person, place, and time.  Skin: Skin is warm and dry.  Left Foot (see image below). Well healing ulcer on anterior aspect of foot. Mild cellulitis surrounding area. No swelling. No purulent drainage. Distal pulses appreciated by Doppler. Motor/sensation intact.   Psychiatric: He has a normal mood and affect. His speech is normal and behavior is normal. Thought content normal.  Nursing note and vitals reviewed.    ED Treatments / Results  Labs (all labs ordered are listed, but only abnormal results are displayed) Labs Reviewed  COMPREHENSIVE  METABOLIC PANEL - Abnormal; Notable for the following:       Result Value   Sodium 130 (*)    Chloride 100 (*)    CO2 18 (*)    BUN 31 (*)    Creatinine, Ser 1.39 (*)    Calcium 8.8 (*)    Albumin 3.1 (*)    GFR calc non Af Amer 46 (*)    GFR calc Af Amer 53 (*)    All other components within normal limits  CBC WITH DIFFERENTIAL/PLATELET - Abnormal; Notable for the following:    WBC 11.9 (*)    RBC 3.80 (*)    Hemoglobin 11.5 (*)    HCT 34.8 (*)    RDW 16.1 (*)    Platelets 567 (*)  Neutro Abs 9.0 (*)    Basophils Absolute 0.2 (*)    All other components within normal limits  I-STAT CG4 LACTIC ACID, ED - Abnormal; Notable for the following:    Lactic Acid, Venous 3.40 (*)    All other components within normal limits  CULTURE, BLOOD (ROUTINE X 2)  CULTURE, BLOOD (ROUTINE X 2)    EKG  EKG Interpretation None      Radiology Dg Foot Complete Left  Result Date: 10/03/2015 CLINICAL DATA:  Left foot pain.  Evaluate for osteomyelitis. EXAM: LEFT FOOT - COMPLETE 3+ VIEW COMPARISON:  09/11/2015 FINDINGS: Unchanged eroded appearance to the distal phalanges of the first and second digits with overlying skin irregularity at the second digit. No osteomyelitis was reported on MRI 09/13/2015. No new erosive changes are seen. No joint destruction or narrowing acutely. There is chronic ankylosis at the second digit. Osteopenia without fracture or subluxation. No soft tissue emphysema or foreign body. 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 No apparent change compared to radiography and MRI 09/11/2015 and 09/13/2015 at [facility]. Electronically Signed   By: Marnee Spring M.D.   On: 10/03/2015 19:10   Procedures Procedures (including critical care time)  Medications Ordered in ED Medications  sodium chloride 0.9 % bolus 1,000 mL (1,000 mLs Intravenous New Bag/Given 10/03/15 1817)  morphine 2 MG/ML injection 2 mg (2 mg Intravenous Given 10/03/15 1821)    Initial Impression / Assessment and Plan / ED Course  I have reviewed the triage vital signs and the nursing notes.  Pertinent labs & imaging results that were available during my care of the patient were reviewed by me and considered in my medical decision making (see chart for details).  Clinical Course   Final Clinical Impressions(s) / ED Diagnoses   I have reviewed and evaluated the relevant laboratory values I have reviewed and evaluated the relevant imaging studies.  I have reviewed the relevant previous healthcare records.I obtained HPI from historian. Patient discussed with supervising physician  ED Course:  Assessment: Pt is a 84yM with hx Diastolic CHF with EF 35%, PVD who presents with left foot pain. Noted hx of left femoral-popliteal bypass with saphenous vein due to PVD on 09-17-15. DCed home and pt did noth ave wound care follow up at home. Unchanged bandage until visit today. On exam, pt in NAD. Nontoxic/nonseptic appearing. VSS. Afebrile. Lungs CTA. Heart RRR. Distal pulses appreciated on LLE with doppler. Well healed ulcers with mild cellulitis surrounding skin. No swelling or purulence noted. WBC 11.9 with lactic acid 3.89 likely 2/2 dehydration rather than infection.  Imaging with no acute abnormalities. Given minimal bolus of fluids in ED due to CHF. Consult to Vascular Surgery (Dr. Arbie Cookey) who will have his office call tomorrow morning for follow up appointment. Placed consult to case management for wound care management. Plan is to DC home with follow up to Vascular Surgery. At time of discharge, Patient is in no acute distress. Vital Signs are stable. Patient is able to ambulate. Patient able to tolerate PO.    Disposition/Plan:  DC Home Additional Verbal discharge instructions given and discussed with patient.  Pt Instructed to f/u with Vascular Surgery in the next week for evaluation and treatment of symptoms. Return precautions given Pt acknowledges and agrees with  plan  Supervising Physician Lavera Guise, MD   Final diagnoses:  Cellulitis of left lower extremity    New Prescriptions New Prescriptions   No medications on file  Audry Pili, PA-C 10/03/15 1958    Lavera Guise, MD 10/03/15 2206    Lavera Guise, MD 10/03/15 2206

## 2015-10-03 NOTE — ED Triage Notes (Addendum)
Patient here with increased left leg pain and foot pain for several days. Had femoral popliteal bypass surgery the end of august. Foot cool and unable to palpate pulse on arrival . Toes with dark drainage and foul odor to foot. Patient states that home health RN never came for his wound dressings. Early with vascular did the surgery

## 2015-10-03 NOTE — ED Notes (Addendum)
1st set of blood cultures collected at triage

## 2015-10-03 NOTE — ED Notes (Signed)
Bolus stopped at 500cc per PA. Patient has HF.

## 2015-10-03 NOTE — ED Notes (Signed)
Patient Alert and oriented X4. Stable and ambulatory. Patient verbalized understanding of the discharge instructions.  Patient belongings were taken by the patient.  

## 2015-10-04 ENCOUNTER — Telehealth: Payer: Self-pay | Admitting: *Deleted

## 2015-10-04 NOTE — Telephone Encounter (Signed)
EDCM received call from Nathaniel Curtis stating Nathaniel Curtis did not have availability in [place] at this time.  EDCM contacted Nathaniel Curtis (referred by [facility]) with [facility] to establish care.  Awaiting call form Nathaniel Curtis to start home health services.

## 2015-10-04 NOTE — Telephone Encounter (Signed)
[facility] noted consult from 9/10 to set up home health services for wound care.  EDCM contacted [facility] as they are in network with pt insurance company.  Awaiting call back from Garnetta Buddy to complete set up.

## 2015-10-05 ENCOUNTER — Encounter (HOSPITAL_COMMUNITY): Payer: Self-pay | Admitting: Emergency Medicine

## 2015-10-05 ENCOUNTER — Emergency Department (HOSPITAL_COMMUNITY)
Admission: EM | Admit: 2015-10-05 | Discharge: 2015-10-06 | Disposition: A | Discharge status: Home / Self Care | Payer: Medicare HMO | Attending: Emergency Medicine | Admitting: Emergency Medicine

## 2015-10-05 ENCOUNTER — Encounter: Payer: Medicare HMO | Admitting: Vascular Surgery

## 2015-10-05 DIAGNOSIS — Z79899 Other long term (current) drug therapy: Secondary | ICD-10-CM | POA: Insufficient documentation

## 2015-10-05 DIAGNOSIS — M79671 Pain in right foot: Secondary | ICD-10-CM | POA: Insufficient documentation

## 2015-10-05 DIAGNOSIS — L97521 Non-pressure chronic ulcer of other part of left foot limited to breakdown of skin: Secondary | ICD-10-CM

## 2015-10-05 DIAGNOSIS — Z7982 Long term (current) use of aspirin: Secondary | ICD-10-CM | POA: Insufficient documentation

## 2015-10-05 DIAGNOSIS — I739 Peripheral vascular disease, unspecified: Secondary | ICD-10-CM | POA: Insufficient documentation

## 2015-10-05 DIAGNOSIS — M79672 Pain in left foot: Secondary | ICD-10-CM

## 2015-10-05 DIAGNOSIS — I509 Heart failure, unspecified: Secondary | ICD-10-CM | POA: Insufficient documentation

## 2015-10-05 DIAGNOSIS — D509 Iron deficiency anemia, unspecified: Secondary | ICD-10-CM | POA: Insufficient documentation

## 2015-10-05 DIAGNOSIS — D649 Anemia, unspecified: Secondary | ICD-10-CM

## 2015-10-05 DIAGNOSIS — N289 Disorder of kidney and ureter, unspecified: Secondary | ICD-10-CM

## 2015-10-05 DIAGNOSIS — N189 Chronic kidney disease, unspecified: Secondary | ICD-10-CM | POA: Insufficient documentation

## 2015-10-05 DIAGNOSIS — F17210 Nicotine dependence, cigarettes, uncomplicated: Secondary | ICD-10-CM | POA: Diagnosis not present

## 2015-10-05 DIAGNOSIS — I110 Hypertensive heart disease with heart failure: Secondary | ICD-10-CM | POA: Insufficient documentation

## 2015-10-05 MED ORDER — MORPHINE SULFATE (PF) 4 MG/ML IV SOLN
4.0000 mg | Freq: Once | INTRAVENOUS | Status: AC
  Administered 2015-10-06: 4 mg via INTRAVENOUS
  Filled 2015-10-05: qty 1

## 2015-10-05 NOTE — ED Provider Notes (Signed)
[facility]-EMERGENCY DEPT Provider Note   CSN: [id number] Arrival date & time: 10/05/15  2334  By signing my name below, I, Emmanuella Mensah, attest that this documentation has been prepared under the direction and in the presence of Dione Booze, MD. Electronically Signed: Angelene Giovanni, ED Scribe. 10/05/15. 12:00 AM.   History   Chief Complaint Chief Complaint  Patient presents with  . Foot Pain    HPI Comments: Nathaniel Curtis is a 82 y.o. male with a hx of rheumatoid arthritis, CHF, PVD, and foot ulcers who presents to the Emergency Department complaining of gradually worsening 10/10 sharp bilateral foot pain with multiple ulcers onset 09/20/15. No alleviating factors noted. He states that he has tried Naproxen PTA with no relief. Pt was seen on 10/03/15 for these symptoms where he was instructed to follow up with Vascular surgery. He explains that he was unable to get transportation to make his appointment today with Vascular Surgery. Per chart review, pt was seen by Dr. Arbie Cookey and discharged on 09/20/15 with instructions to follow up with wound care, however that was never established for pt. He reports that he is a current one pack a day cigarette smoker. No fevers or chills.   The history is provided by the patient. No language interpreter was used.  Foot Pain  This is a chronic problem. The current episode started more than 1 week ago. The problem occurs constantly. The problem has been gradually worsening. Nothing aggravates the symptoms. The symptoms are relieved by narcotics. Treatments tried: Naproxen. The treatment provided no relief.    Past Medical History:  Diagnosis Date  . Arthritis   . CHF (congestive heart failure) (HCC)    Echo 09/26/11 - EF 50-55%, grade 1 diastolic dysfunction, no RWMAs, no significant valvular abnls  . ETOH abuse    70+ yrs, 3 glasses of brandy/night   . Glaucoma   . Heart attack (HCC) 2009   found on EKG, no cath  . Hyperlipidemia   . PVD (peripheral  vascular disease) (HCC)    severe iliac disease  . Tobacco abuse    1ppd for 70+yrs    Patient Active Problem List   Diagnosis Date Noted  . Skin ulcers of foot, bilateral (HCC) 09/15/2015  . Foot ulcer (HCC) 09/15/2015  . PAD (peripheral artery disease) (HCC) 11/28/2011  . Pre-operative cardiovascular examination 09/27/2011  . PVD (peripheral vascular disease) with claudication (HCC) 09/24/2011  . Cellulitis of right foot 09/24/2011  . HTN (hypertension) 09/24/2011  . Hyperlipidemia 09/24/2011  . Tobacco abuse 09/24/2011  . CHF, chronic (HCC) 09/24/2011  . Alcohol abuse 09/24/2011    Past Surgical History:  Procedure Laterality Date  . AORTA - BILATERAL FEMORAL ARTERY BYPASS GRAFT  10/13/2011   Procedure: AORTA BIFEMORAL BYPASS GRAFT;  Surgeon: Larina Earthly, MD;  Location: [facility] OR;  Service: Vascular;  Laterality: N/A;  Aortic bifemoral bypass with 10mm x 11mm graft.    Marland Kitchen CARDIAC CATHETERIZATION  10/2007  . CATARACT EXTRACTION W/ INTRAOCULAR LENS IMPLANT  RIGHT EYE 2009  . EYE SURGERY  02/09  . FEMORAL-POPLITEAL BYPASS GRAFT Left 09/17/2015   Procedure: LEFT FEMORAL-POPLITEAL ARTERY  BYPASS WITH SAPHENOUS VEIN;  Surgeon: Larina Earthly, MD;  Location: [facility] OR;  Service: Vascular;  Laterality: Left;  Marland Kitchen VEIN HARVEST Left 09/17/2015   Procedure: LEFT SAPHENOUS VEIN HARVEST;  Surgeon: Larina Earthly, MD;  Location: [facility] OR;  Service: Vascular;  Laterality: Left;       Home Medications    Prior  to Admission medications   Medication Sig Start Date End Date Taking? Authorizing Provider  aspirin 81 MG chewable tablet Chew 1 tablet (81 mg total) by mouth daily. 09/20/15   Leroy Sea, MD  atorvastatin (LIPITOR) 20 MG tablet Take 1 tablet (20 mg total) by mouth daily at 6 PM. Can be switched to any other statin at same dose 09/20/15   Leroy Sea, MD  cephALEXin (KEFLEX) 500 MG capsule Take 1 capsule (500 mg total) by mouth 4 (four) times daily. 10/03/15   Audry Pili, PA-C  docusate  sodium (COLACE) 100 MG capsule Take 1 capsule (100 mg total) by mouth 2 (two) times daily. 09/20/15   Leroy Sea, MD  lisinopril (PRINIVIL,ZESTRIL) 10 MG tablet Take 1 tablet (10 mg total) by mouth daily. 09/20/15   Leroy Sea, MD  metoprolol succinate (TOPROL-XL) 25 MG 24 hr tablet Take 1 tablet (25 mg total) by mouth daily. 09/20/15   Leroy Sea, MD  nitroGLYCERIN (NITROSTAT) 0.4 MG SL tablet Place 0.4 mg under the tongue every 5 (five) minutes as needed. As needed for chest pain.    Historical Provider, MD  oxyCODONE-acetaminophen (PERCOCET/ROXICET) 5-325 MG tablet Take 1-2 tablets by mouth every 4 (four) hours as needed. 09/20/15   Leroy Sea, MD  traMADol (ULTRAM) 50 MG tablet Take 1 tablet (50 mg total) by mouth every 6 (six) hours as needed for pain. Patient not taking: Reported on 09/15/2015 12/29/11   Larina Earthly, MD    Family History Family History  Problem Relation Age of Onset  . Heart disease Maternal Grandfather     MI 54yo    Social History Social History  Substance Use Topics  . Smoking status: Current Every Day Smoker    Packs/day: 1.00    Years: 70.00    Types: Cigarettes  . Smokeless tobacco: Former Neurosurgeon  . Alcohol use Yes     Comment: 70+ years, 3 glasses of brandy a night     Allergies   Lyrica [pregabalin]   Review of Systems Review of Systems  Constitutional: Negative for chills and fever.  Musculoskeletal: Positive for arthralgias.  Skin: Positive for wound.  All other systems reviewed and are negative.    Physical Exam Updated Vital Signs Ht 5\' 9"  (1.753 m)   Wt 126 lb (57.2 kg)   BMI 18.61 kg/m   Physical Exam  Constitutional: He is oriented to person, place, and time. He appears well-developed and well-nourished.  HENT:  Head: Normocephalic and atraumatic.  Eyes: EOM are normal. Pupils are equal, round, and reactive to light.  Neck: Normal range of motion. Neck supple. No JVD present.  Cardiovascular: Normal rate,  regular rhythm and normal heart sounds.   No murmur heard. Pulmonary/Chest: Effort normal and breath sounds normal. He has no wheezes. He has no rales. He exhibits no tenderness.  Abdominal: Soft. Bowel sounds are normal. He exhibits no distension and no mass. There is no tenderness.  Musculoskeletal: Normal range of motion. He exhibits no edema.  Ulcerations on the dorsum of left foot the the tips of the left 2nd, 3rd and 4th toes s/p partial ambulation of the left 1st toe. Generalized desquamation of feet and lower legs. DP palpable. Cap refill 2 seconds. Feet very TTP.   Lymphadenopathy:    He has no cervical adenopathy.  Neurological: He is alert and oriented to person, place, and time. No cranial nerve deficit. He exhibits normal muscle tone. Coordination normal.  Skin:  Skin is warm and dry. No rash noted.  Psychiatric: He has a normal mood and affect. His behavior is normal. Judgment and thought content normal.  Nursing note and vitals reviewed.    ED Treatments / Results  DIAGNOSTIC STUDIES: Oxygen Saturation is 100% on RA, normal by my interpretation.    COORDINATION OF CARE: 11:57 PM- Pt advised of plan for treatment and pt agrees. Pt will receive lab work for further evaluation. He will also receive morphine.    Labs (all labs ordered are listed, but only abnormal results are displayed) Labs Reviewed  BASIC METABOLIC PANEL - Abnormal; Notable for the following:       Result Value   CO2 21 (*)    BUN 22 (*)    Creatinine, Ser 1.34 (*)    Calcium 8.6 (*)    GFR calc non Af Amer 48 (*)    GFR calc Af Amer 55 (*)    All other components within normal limits  CBC WITH DIFFERENTIAL/PLATELET - Abnormal; Notable for the following:    WBC 10.9 (*)    RBC 3.52 (*)    Hemoglobin 10.6 (*)    HCT 32.0 (*)    RDW 16.2 (*)    Neutro Abs 8.1 (*)    Eosinophils Absolute 0.9 (*)    All other components within normal limits  SEDIMENTATION RATE - Abnormal; Notable for the following:     Sed Rate 54 (*)    All other components within normal limits  I-STAT CG4 LACTIC ACID, ED - Abnormal; Notable for the following:    Lactic Acid, Venous 2.09 (*)    All other components within normal limits    Procedures Procedures (including critical care time)  Medications Ordered in ED Medications  morphine 4 MG/ML injection 4 mg (4 mg Intravenous Given 10/06/15 0023)  oxyCODONE-acetaminophen (PERCOCET/ROXICET) 5-325 MG per tablet 1 tablet (1 tablet Oral Given 10/06/15 0202)     Initial Impression / Assessment and Plan / ED Course  Dione Booze, MD has reviewed the triage vital signs and the nursing notes.  Pertinent labs & imaging results that were available during my care of the patient were reviewed by me and considered in my medical decision making (see chart for details).  Clinical Course    Bilateral foot pain secondary to peripheral vascular disease. Old records are reviewed and he was seen in the ED 2 days ago with similar complaints. Screening labs are obtained, and WBC has decreased, lactic acid level has decreased, sedimentation rate has decreased. Renal function is unchanged.,,,,,h Chronic anemia is unchanged from baseline. He was given a dose of morphine and feels much better. His main complaint was lack of pain medication. He is discharged with prescription for oxycodone have acetaminophen. Importance of follow-up with his vascular surgeon was stressed. Also, importance of discontinuing tobacco use was stressed. Return precautions were discussed.  Final Clinical Impressions(s) / ED Diagnoses   Final diagnoses:  Peripheral vascular disease (HCC)  Foot ulcer, left, limited to breakdown of skin (HCC)  Normochromic normocytic anemia  Renal insufficiency  Bilateral foot pain    New Prescriptions Discharge Medication List as of 10/06/2015  1:46 AM     I personally performed the services described in this documentation, which was scribed in my presence. The recorded  information has been reviewed and is accurate.       Dione Booze, MD 10/06/15 [id number]

## 2015-10-05 NOTE — ED Triage Notes (Signed)
Per EMS, pt from home with c/o bilateral foot pain. Pt states he has pain to both feet and recent surgery to left leg. BP-112/80, HR-91, SpO2-96% RA

## 2015-10-06 ENCOUNTER — Encounter: Payer: Self-pay | Admitting: Vascular Surgery

## 2015-10-06 ENCOUNTER — Encounter: Payer: Self-pay | Admitting: Family

## 2015-10-06 ENCOUNTER — Ambulatory Visit (INDEPENDENT_AMBULATORY_CARE_PROVIDER_SITE_OTHER): Payer: Medicare HMO | Admitting: Family

## 2015-10-06 ENCOUNTER — Telehealth: Payer: Self-pay | Admitting: Vascular Surgery

## 2015-10-06 VITALS — BP 110/66 | HR 64 | Temp 97.1°F | Ht 69.0 in | Wt 126.0 lb

## 2015-10-06 DIAGNOSIS — L97519 Non-pressure chronic ulcer of other part of right foot with unspecified severity: Secondary | ICD-10-CM

## 2015-10-06 DIAGNOSIS — I779 Disorder of arteries and arterioles, unspecified: Secondary | ICD-10-CM

## 2015-10-06 DIAGNOSIS — F17200 Nicotine dependence, unspecified, uncomplicated: Secondary | ICD-10-CM

## 2015-10-06 DIAGNOSIS — L97529 Non-pressure chronic ulcer of other part of left foot with unspecified severity: Secondary | ICD-10-CM

## 2015-10-06 DIAGNOSIS — Z720 Tobacco use: Secondary | ICD-10-CM

## 2015-10-06 LAB — CBC WITH DIFFERENTIAL/PLATELET
BASOS ABS: 0.1 10*3/uL (ref 0.0–0.1)
Basophils Relative: 1 %
Eosinophils Absolute: 0.9 10*3/uL — ABNORMAL HIGH (ref 0.0–0.7)
Eosinophils Relative: 9 %
HEMATOCRIT: 32 % — AB (ref 39.0–52.0)
Hemoglobin: 10.6 g/dL — ABNORMAL LOW (ref 13.0–17.0)
LYMPHS PCT: 12 %
Lymphs Abs: 1.3 10*3/uL (ref 0.7–4.0)
MCH: 30.1 pg (ref 26.0–34.0)
MCHC: 33.1 g/dL (ref 30.0–36.0)
MCV: 90.9 fL (ref 78.0–100.0)
Monocytes Absolute: 0.5 10*3/uL (ref 0.1–1.0)
Monocytes Relative: 4 %
NEUTROS ABS: 8.1 10*3/uL — AB (ref 1.7–7.7)
NEUTROS PCT: 74 %
PLATELETS: 333 10*3/uL (ref 150–400)
RBC: 3.52 MIL/uL — AB (ref 4.22–5.81)
RDW: 16.2 % — ABNORMAL HIGH (ref 11.5–15.5)
WBC: 10.9 10*3/uL — AB (ref 4.0–10.5)

## 2015-10-06 LAB — BASIC METABOLIC PANEL
ANION GAP: 9 (ref 5–15)
BUN: 22 mg/dL — ABNORMAL HIGH (ref 6–20)
CO2: 21 mmol/L — ABNORMAL LOW (ref 22–32)
Calcium: 8.6 mg/dL — ABNORMAL LOW (ref 8.9–10.3)
Chloride: 106 mmol/L (ref 101–111)
Creatinine, Ser: 1.34 mg/dL — ABNORMAL HIGH (ref 0.61–1.24)
GFR calc Af Amer: 55 mL/min — ABNORMAL LOW (ref >60)
GFR, EST NON AFRICAN AMERICAN: 48 mL/min — AB (ref >60)
Glucose, Bld: 96 mg/dL (ref 65–99)
POTASSIUM: 3.9 mmol/L (ref 3.5–5.1)
SODIUM: 136 mmol/L (ref 135–145)

## 2015-10-06 LAB — SEDIMENTATION RATE: SED RATE: 54 mm/h — AB (ref 0–16)

## 2015-10-06 LAB — I-STAT CG4 LACTIC ACID, ED: Lactic Acid, Venous: 2.09 mmol/L (ref 0.5–1.9)

## 2015-10-06 MED ORDER — TRAMADOL HCL 50 MG PO TABS: 50.0000 mg | ORAL_TABLET | Freq: Four times a day (QID) | ORAL | 0 refills | Status: AC | PRN

## 2015-10-06 MED ORDER — OXYCODONE-ACETAMINOPHEN 5-325 MG PO TABS: 1.0000 | ORAL_TABLET | ORAL | 0 refills | Status: AC | PRN

## 2015-10-06 MED ORDER — OXYCODONE-ACETAMINOPHEN 5-325 MG PO TABS
1.0000 | ORAL_TABLET | Freq: Once | ORAL | Status: AC
  Administered 2015-10-06: 1 via ORAL
  Filled 2015-10-06: qty 1

## 2015-10-06 NOTE — ED Notes (Signed)
Signature pad not working, pt verbalized understanding of discharge instructions and medications.  Reminded pt of appointment that is scheduled today.

## 2015-10-06 NOTE — Discharge Instructions (Signed)
You will need to get in and see Dr. early in order to coordinate treatment. Return if pain is getting worse, or if you start running a fever.

## 2015-10-06 NOTE — Progress Notes (Signed)
Postoperative Visit   History of Present Illness  Nathaniel Curtis is a 82 y.o. year old male patient of Dr. Arbie Cookey who is s/p left femoral to below-knee popliteal bypass with reversed great saphenous vein on 09/17/15 for critical limb ischemia left leg. He returns today for 2 weeks follow up, has transportation issues, states he was at the ED last night for pain in both feet, possibly at [facility] in [place].  Pt states HH was was coming to his house 3x/week after his CABG in 2013, states that stopped in 2014. No HH visits yet since this last vascular surgery.   He states he is not homeless, has an address, listed on file.   Pt states the left foot wounds are improving since the above surgery, right foot wounds seem to be stable.  The patient is able to complete their activities of daily living.  The patient's current symptoms are: pain in his feet that keeps him awake at night.  For VQI Use Only  PRE-ADM LIVING: Home  AMB STATUS: Ambulatory  Physical Examination  Vitals:   10/06/15 0904  BP: 110/66  Pulse: 64  Temp: 97.1 F (36.2 C)  TempSrc: Oral  SpO2: 97%  Weight: 126 lb (57.2 kg)  Height: 5\' 9"  (1.753 m)   Body mass index is 18.61 kg/m.   Doppler signals in bilateral LE PT and DP arteries are brisk.  Both femoral pulses are 2+ palpable.  Left LE: Incisions are healing well with no signs of infection. He is generally hygienically challenged. His feet are encrusted with some dried and semi moist material between his toes. Both feet have epidermal layer thickness areas of denuding, more so on the left, but pt states this has improved since the left LE revascularization procedure on 09/17/15. No swelling in his left leg or either foot. No signs of infection in his feet wounds or left leg incisions.   Medical Decision Making  Zaveon Gillen is a 82 y.o. year old male who presents s/p left femoral to below-knee popliteal bypass with reversed great saphenous vein on  09/17/15 for critical limb ischemia left leg.  The patient's bypass incisions are healing appropriately with resolving of pre-operative symptoms.  Dr. 09/19/15 spoke with and examined pt.   Tramadol 50 mg po, 1 tablet po every 6 hours prn pain, disp #20, 0 refills.  The patient was counseled re smoking cessation and given several free resources re smoking cessation. I discussed in depth with the patient the nature of atherosclerosis, and emphasized the importance of maximal medical management including strict control of blood pressure, blood glucose, and lipid levels, obtaining regular exercise, and cessation of smoking.  The patient is aware that without maximal medical management the underlying atherosclerotic disease process will progress, limiting the benefit of any interventions. The patient's surveillance will included ABI and bypass duplex studies which will be completed in: 3 months, at which time the patient will be re-evaluated by Dr Early. HH to perform daily W-D dressings on wounds of both feet; later received message that [facility] will not perform wet to dry dressing, will request Hydrogel daily dressing changes since pt is unable to perform the dressing changes.   I emphasized the importance of routine surveillance of the patient's bypass, as the vascular surgery literature emphasize the improved patency possible with assisted primary patency procedures versus secondary patency procedures. The patient agrees to participate in their maximal medical care and routine surveillance.  Thank you for allowing [facility] to  participate in this patient's care.  Rayna Brenner, Carma Lair, RN, MSN, FNP-C [facility] Office: [phone]  10/06/2015, 9:17 AM  Clinic MD: Early

## 2015-10-06 NOTE — Patient Instructions (Signed)
Peripheral Vascular Disease Peripheral vascular disease (PVD) is a disease of the blood vessels that are not part of your heart and brain. A simple term for PVD is poor circulation. In most cases, PVD narrows the blood vessels that carry blood from your heart to the rest of your body. This can result in a decreased supply of blood to your arms, legs, and internal organs, like your stomach or kidneys. However, it most often affects a person's lower legs and feet. There are two types of PVD.  Organic PVD. This is the more common type. It is caused by damage to the structure of blood vessels.  Functional PVD. This is caused by conditions that make blood vessels contract and tighten (spasm). Without treatment, PVD tends to get worse over time. PVD can also lead to acute ischemic limb. This is when an arm or limb suddenly has trouble getting enough blood. This is a medical emergency. CAUSES Each type of PVD has many different causes. The most common cause of PVD is buildup of a fatty material (plaque) inside of your arteries (atherosclerosis). Small amounts of plaque can break off from the walls of the blood vessels and become lodged in a smaller artery. This blocks blood flow and can cause acute ischemic limb. Other common causes of PVD include:  Blood clots that form inside of blood vessels.  Injuries to blood vessels.  Diseases that cause inflammation of blood vessels or cause blood vessel spasms.  Health behaviors and health history that increase your risk of developing PVD. RISK FACTORS  You may have a greater risk of PVD if you:  Have a family history of PVD.  Have certain medical conditions, including:  High cholesterol.  Diabetes.  High blood pressure (hypertension).  Coronary heart disease.  Past problems with blood clots.  Past injury, such as burns or a broken bone. These may have damaged blood vessels in your limbs.  Buerger disease. This is caused by inflamed blood  vessels in your hands and feet.  Some forms of arthritis.  Rare birth defects that affect the arteries in your legs.  Use tobacco.  Do not get enough exercise.  Are obese.  Are age 50 or older. SIGNS AND SYMPTOMS  PVD may cause many different symptoms. Your symptoms depend on what part of your body is not getting enough blood. Some common signs and symptoms include:  Cramps in your lower legs. This may be a symptom of poor leg circulation (claudication).  Pain and weakness in your legs while you are physically active that goes away when you rest (intermittent claudication).  Leg pain when at rest.  Leg numbness, tingling, or weakness.  Coldness in a leg or foot, especially when compared with the other leg.  Skin or hair changes. These can include:  Hair loss.  Shiny skin.  Pale or bluish skin.  Thick toenails.  Inability to get or maintain an erection (erectile dysfunction). People with PVD are more prone to developing ulcers and sores on their toes, feet, or legs. These may take longer than normal to heal. DIAGNOSIS Your health care provider may diagnose PVD from your signs and symptoms. The health care provider will also do a physical exam. You may have tests to find out what is causing your PVD and determine its severity. Tests may include:  Blood pressure recordings from your arms and legs and measurements of the strength of your pulses (pulse volume recordings).  Imaging studies using sound waves to take pictures of   the blood flow through your blood vessels (Doppler ultrasound).  Injecting a dye into your blood vessels before having imaging studies using:  X-rays (angiogram or arteriogram).  Computer-generated X-rays (CT angiogram).  A powerful electromagnetic field and a computer (magnetic resonance angiogram or MRA). TREATMENT Treatment for PVD depends on the cause of your condition and the severity of your symptoms. It also depends on your age. Underlying  causes need to be treated and controlled. These include long-lasting (chronic) conditions, such as diabetes, high cholesterol, and high blood pressure. You may need to first try making lifestyle changes and taking medicines. Surgery may be needed if these do not work. Lifestyle changes may include:  Quitting smoking.  Exercising regularly.  Following a low-fat, low-cholesterol diet. Medicines may include:  Blood thinners to prevent blood clots.  Medicines to improve blood flow.  Medicines to improve your blood cholesterol levels. Surgical procedures may include:  A procedure that uses an inflated balloon to open a blocked artery and improve blood flow (angioplasty).  A procedure to put in a tube (stent) to keep a blocked artery open (stent implant).  Surgery to reroute blood flow around a blocked artery (peripheral bypass surgery).  Surgery to remove dead tissue from an infected wound on the affected limb.  Amputation. This is surgical removal of the affected limb. This may be necessary in cases of acute ischemic limb that are not improved through medical or surgical treatments. HOME CARE INSTRUCTIONS  Take medicines only as directed by your health care provider.  Do not use any tobacco products, including cigarettes, chewing tobacco, or electronic cigarettes. If you need help quitting, ask your health care provider.  Lose weight if you are overweight, and maintain a healthy weight as directed by your health care provider.  Eat a diet that is low in fat and cholesterol. If you need help, ask your health care provider.  Exercise regularly. Ask your health care provider to suggest some good activities for you.  Use compression stockings or other mechanical devices as directed by your health care provider.  Take good care of your feet.  Wear comfortable shoes that fit well.  Check your feet often for any cuts or sores. SEEK MEDICAL CARE IF:  You have cramps in your legs  while walking.  You have leg pain when you are at rest.  You have coldness in a leg or foot.  Your skin changes.  You have erectile dysfunction.  You have cuts or sores on your feet that are not healing. SEEK IMMEDIATE MEDICAL CARE IF:  Your arm or leg turns cold and blue.  Your arms or legs become red, warm, swollen, painful, or numb.  You have chest pain or trouble breathing.  You suddenly have weakness in your face, arm, or leg.  You become very confused or lose the ability to speak.  You suddenly have a very bad headache or lose your vision.   This information is not intended to replace advice given to you by your health care provider. Make sure you discuss any questions you have with your health care provider.   Document Released: 02/17/2004 Document Revised: 01/30/2014 Document Reviewed: 06/19/2013 Elsevier Interactive Patient Education 2016 [employer]     Steps to Quit Smoking  Smoking tobacco can be harmful to your health and can affect almost every organ in your body. Smoking puts you, and those around you, at risk for developing many serious chronic diseases. Quitting smoking is difficult, but it is one of   the best things that you can do for your health. It is never too late to quit. WHAT ARE THE BENEFITS OF QUITTING SMOKING? When you quit smoking, you lower your risk of developing serious diseases and conditions, such as:  Lung cancer or lung disease, such as COPD.  Heart disease.  Stroke.  Heart attack.  Infertility.  Osteoporosis and bone fractures. Additionally, symptoms such as coughing, wheezing, and shortness of breath may get better when you quit. You may also find that you get sick less often because your body is stronger at fighting off colds and infections. If you are pregnant, quitting smoking can help to reduce your chances of having a baby of low birth weight. HOW DO I GET READY TO QUIT? When you decide to quit smoking, create a plan to  make sure that you are successful. Before you quit:  Pick a date to quit. Set a date within the next two weeks to give you time to prepare.  Write down the reasons why you are quitting. Keep this list in places where you will see it often, such as on your bathroom mirror or in your car or wallet.  Identify the people, places, things, and activities that make you want to smoke (triggers) and avoid them. Make sure to take these actions:  Throw away all cigarettes at home, at work, and in your car.  Throw away smoking accessories, such as ashtrays and lighters.  Clean your car and make sure to empty the ashtray.  Clean your home, including curtains and carpets.  Tell your family, friends, and coworkers that you are quitting. Support from your loved ones can make quitting easier.  Talk with your health care provider about your options for quitting smoking.  Find out what treatment options are covered by your health insurance. WHAT STRATEGIES CAN I USE TO QUIT SMOKING?  Talk with your healthcare provider about different strategies to quit smoking. Some strategies include:  Quitting smoking altogether instead of gradually lessening how much you smoke over a period of time. Research shows that quitting "cold turkey" is more successful than gradually quitting.  Attending in-person counseling to help you build problem-solving skills. You are more likely to have success in quitting if you attend several counseling sessions. Even short sessions of 10 minutes can be effective.  Finding resources and support systems that can help you to quit smoking and remain smoke-free after you quit. These resources are most helpful when you use them often. They can include:  Online chats with a counselor.  Telephone quitlines.  Printed self-help materials.  Support groups or group counseling.  Text messaging programs.  Mobile phone applications.  Taking medicines to help you quit smoking. (If you are  pregnant or breastfeeding, talk with your health care provider first.) Some medicines contain nicotine and some do not. Both types of medicines help with cravings, but the medicines that include nicotine help to relieve withdrawal symptoms. Your health care provider may recommend:  Nicotine patches, gum, or lozenges.  Nicotine inhalers or sprays.  Non-nicotine medicine that is taken by mouth. Talk with your health care provider about combining strategies, such as taking medicines while you are also receiving in-person counseling. Using these two strategies together makes you more likely to succeed in quitting than if you used either strategy on its own. If you are pregnant or breastfeeding, talk with your health care provider about finding counseling or other support strategies to quit smoking. Do not take medicine to help you   quit smoking unless told to do so by your health care provider. WHAT THINGS CAN I DO TO MAKE IT EASIER TO QUIT? Quitting smoking might feel overwhelming at first, but there is a lot that you can do to make it easier. Take these important actions:  Reach out to your family and friends and ask that they support and encourage you during this time. Call telephone quitlines, reach out to support groups, or work with a counselor for support.  Ask people who smoke to avoid smoking around you.  Avoid places that trigger you to smoke, such as bars, parties, or smoke-break areas at work.  Spend time around people who do not smoke.  Lessen stress in your life, because stress can be a smoking trigger for some people. To lessen stress, try:  Exercising regularly.  Deep-breathing exercises.  Yoga.  Meditating.  Performing a body scan. This involves closing your eyes, scanning your body from head to toe, and noticing which parts of your body are particularly tense. Purposefully relax the muscles in those areas.  Download or purchase mobile phone or tablet apps (applications)  that can help you stick to your quit plan by providing reminders, tips, and encouragement. There are many free apps, such as QuitGuide from the CDC (Centers for Disease Control and Prevention). You can find other support for quitting smoking (smoking cessation) through smokefree.gov and other websites. HOW WILL I FEEL WHEN I QUIT SMOKING? Within the first 24 hours of quitting smoking, you may start to feel some withdrawal symptoms. These symptoms are usually most noticeable 2-3 days after quitting, but they usually do not last beyond 2-3 weeks. Changes or symptoms that you might experience include:  Mood swings.  Restlessness, anxiety, or irritation.  Difficulty concentrating.  Dizziness.  Strong cravings for sugary foods in addition to nicotine.  Mild weight gain.  Constipation.  Nausea.  Coughing or a sore throat.  Changes in how your medicines work in your body.  A depressed mood.  Difficulty sleeping (insomnia). After the first 2-3 weeks of quitting, you may start to notice more positive results, such as:  Improved sense of smell and taste.  Decreased coughing and sore throat.  Slower heart rate.  Lower blood pressure.  Clearer skin.  The ability to breathe more easily.  Fewer sick days. Quitting smoking is very challenging for most people. Do not get discouraged if you are not successful the first time. Some people need to make many attempts to quit before they achieve long-term success. Do your best to stick to your quit plan, and talk with your health care provider if you have any questions or concerns.   This information is not intended to replace advice given to you by your health care provider. Make sure you discuss any questions you have with your health care provider.   Document Released: 01/03/2001 Document Revised: 05/26/2014 Document Reviewed: 05/26/2014 Elsevier Interactive Patient Education 2016 [employer]  

## 2015-10-06 NOTE — Progress Notes (Signed)
This encounter was created in error - please disregard.Patient ID: Nathaniel Curtis, male   DOB: [birth year], 82 y.o.   MRN: [id number]

## 2015-10-06 NOTE — ED Notes (Signed)
Please see downtime documentation.

## 2015-10-06 NOTE — Telephone Encounter (Signed)
I spoke with Mrs. San Marino to make arrangements for patient's transport home after his office visit today.  She had attempted to secure someone to pick him up, but had been  unsuccessful. We agreed that she would pay for tranportation since the patient did not have adequate funds to pay. I connected her by phone with Morrie Sheldon with "[employer] She paid by credit card and Mr. Hangartner left the office with the driver.  During the course of our conversation, Mrs. San Marino expressed concern about her father's compliance with medical care and the fact that he did not wish to relocate to [place] to live near her.  I gave her the telephone number for [facility] to see if they could help.  Renato Gails

## 2015-10-08 ENCOUNTER — Telehealth: Payer: Self-pay | Admitting: *Deleted

## 2015-10-08 LAB — CULTURE, BLOOD (ROUTINE X 2)
CULTURE: NO GROWTH
CULTURE: NO GROWTH

## 2015-10-08 NOTE — Telephone Encounter (Signed)
Erie Noe, nurse with [facility] called requesting verbal order to provide PT and OT in addition to wound care.  Order was given.

## 2016-01-04 ENCOUNTER — Encounter: Payer: Self-pay | Admitting: Vascular Surgery

## 2016-01-10 ENCOUNTER — Other Ambulatory Visit: Payer: Self-pay | Admitting: *Deleted

## 2016-01-10 DIAGNOSIS — I739 Peripheral vascular disease, unspecified: Secondary | ICD-10-CM

## 2016-01-10 DIAGNOSIS — Z48812 Encounter for surgical aftercare following surgery on the circulatory system: Secondary | ICD-10-CM

## 2016-01-11 ENCOUNTER — Inpatient Hospital Stay (HOSPITAL_COMMUNITY): Admission: RE | Admit: 2016-01-11 | Payer: Medicare HMO | Source: Ambulatory Visit

## 2016-01-11 ENCOUNTER — Ambulatory Visit: Payer: Medicare HMO | Admitting: Vascular Surgery

## 2016-02-23 DIAGNOSIS — J189 Pneumonia, unspecified organism: Secondary | ICD-10-CM

## 2016-02-23 DIAGNOSIS — I739 Peripheral vascular disease, unspecified: Secondary | ICD-10-CM

## 2016-02-23 DIAGNOSIS — R130 Aphagia: Secondary | ICD-10-CM

## 2016-02-23 DIAGNOSIS — J449 Chronic obstructive pulmonary disease, unspecified: Secondary | ICD-10-CM

## 2016-02-23 DIAGNOSIS — E440 Moderate protein-calorie malnutrition: Secondary | ICD-10-CM

## 2016-02-24 DIAGNOSIS — R130 Aphagia: Secondary | ICD-10-CM | POA: Diagnosis not present

## 2016-02-24 DIAGNOSIS — J449 Chronic obstructive pulmonary disease, unspecified: Secondary | ICD-10-CM | POA: Diagnosis not present

## 2016-02-24 DIAGNOSIS — E440 Moderate protein-calorie malnutrition: Secondary | ICD-10-CM | POA: Diagnosis not present

## 2016-02-24 DIAGNOSIS — J189 Pneumonia, unspecified organism: Secondary | ICD-10-CM | POA: Diagnosis not present

## 2016-02-25 DIAGNOSIS — R130 Aphagia: Secondary | ICD-10-CM | POA: Diagnosis not present

## 2016-02-25 DIAGNOSIS — J189 Pneumonia, unspecified organism: Secondary | ICD-10-CM | POA: Diagnosis not present

## 2016-02-25 DIAGNOSIS — E440 Moderate protein-calorie malnutrition: Secondary | ICD-10-CM | POA: Diagnosis not present

## 2016-02-25 DIAGNOSIS — J449 Chronic obstructive pulmonary disease, unspecified: Secondary | ICD-10-CM | POA: Diagnosis not present

## 2016-04-19 DIAGNOSIS — M71321 Other bursal cyst, right elbow: Secondary | ICD-10-CM

## 2016-04-19 DIAGNOSIS — M069 Rheumatoid arthritis, unspecified: Secondary | ICD-10-CM

## 2016-04-19 DIAGNOSIS — I340 Nonrheumatic mitral (valve) insufficiency: Secondary | ICD-10-CM | POA: Diagnosis not present

## 2016-04-19 DIAGNOSIS — J90 Pleural effusion, not elsewhere classified: Secondary | ICD-10-CM

## 2016-04-19 DIAGNOSIS — I739 Peripheral vascular disease, unspecified: Secondary | ICD-10-CM | POA: Diagnosis not present

## 2016-04-19 DIAGNOSIS — I509 Heart failure, unspecified: Secondary | ICD-10-CM | POA: Diagnosis not present

## 2016-04-19 DIAGNOSIS — D509 Iron deficiency anemia, unspecified: Secondary | ICD-10-CM | POA: Diagnosis not present

## 2016-04-19 DIAGNOSIS — L03115 Cellulitis of right lower limb: Secondary | ICD-10-CM | POA: Diagnosis not present

## 2016-04-19 DIAGNOSIS — J811 Chronic pulmonary edema: Secondary | ICD-10-CM | POA: Diagnosis not present

## 2016-04-19 DIAGNOSIS — J449 Chronic obstructive pulmonary disease, unspecified: Secondary | ICD-10-CM | POA: Diagnosis not present

## 2016-04-19 DIAGNOSIS — I2510 Atherosclerotic heart disease of native coronary artery without angina pectoris: Secondary | ICD-10-CM | POA: Diagnosis not present

## 2016-04-19 DIAGNOSIS — E440 Moderate protein-calorie malnutrition: Secondary | ICD-10-CM | POA: Diagnosis not present

## 2016-04-20 DIAGNOSIS — M069 Rheumatoid arthritis, unspecified: Secondary | ICD-10-CM | POA: Diagnosis not present

## 2016-04-20 DIAGNOSIS — J90 Pleural effusion, not elsewhere classified: Secondary | ICD-10-CM | POA: Diagnosis not present

## 2016-04-20 DIAGNOSIS — I509 Heart failure, unspecified: Secondary | ICD-10-CM | POA: Diagnosis not present

## 2016-04-20 DIAGNOSIS — M71321 Other bursal cyst, right elbow: Secondary | ICD-10-CM | POA: Diagnosis not present

## 2016-04-21 DIAGNOSIS — J90 Pleural effusion, not elsewhere classified: Secondary | ICD-10-CM | POA: Diagnosis not present

## 2016-04-21 DIAGNOSIS — M069 Rheumatoid arthritis, unspecified: Secondary | ICD-10-CM | POA: Diagnosis not present

## 2016-04-21 DIAGNOSIS — M71321 Other bursal cyst, right elbow: Secondary | ICD-10-CM | POA: Diagnosis not present

## 2016-04-21 DIAGNOSIS — I509 Heart failure, unspecified: Secondary | ICD-10-CM | POA: Diagnosis not present

## 2016-04-22 DIAGNOSIS — I509 Heart failure, unspecified: Secondary | ICD-10-CM | POA: Diagnosis not present

## 2016-04-22 DIAGNOSIS — I340 Nonrheumatic mitral (valve) insufficiency: Secondary | ICD-10-CM

## 2016-04-22 DIAGNOSIS — J90 Pleural effusion, not elsewhere classified: Secondary | ICD-10-CM | POA: Diagnosis not present

## 2016-04-22 DIAGNOSIS — M71321 Other bursal cyst, right elbow: Secondary | ICD-10-CM | POA: Diagnosis not present

## 2016-04-22 DIAGNOSIS — M069 Rheumatoid arthritis, unspecified: Secondary | ICD-10-CM | POA: Diagnosis not present

## 2016-05-23 DEATH — deceased

## 2017-03-17 IMAGING — DX DG FOOT COMPLETE 3+V*L*
3 series · 3 of 3 positions shown · non-contrast
Comparison: 09/11/2015

CLINICAL DATA: Left foot pain.  Evaluate for osteomyelitis.

EXAM:
LEFT FOOT - COMPLETE 3+ VIEW

[foot ap]
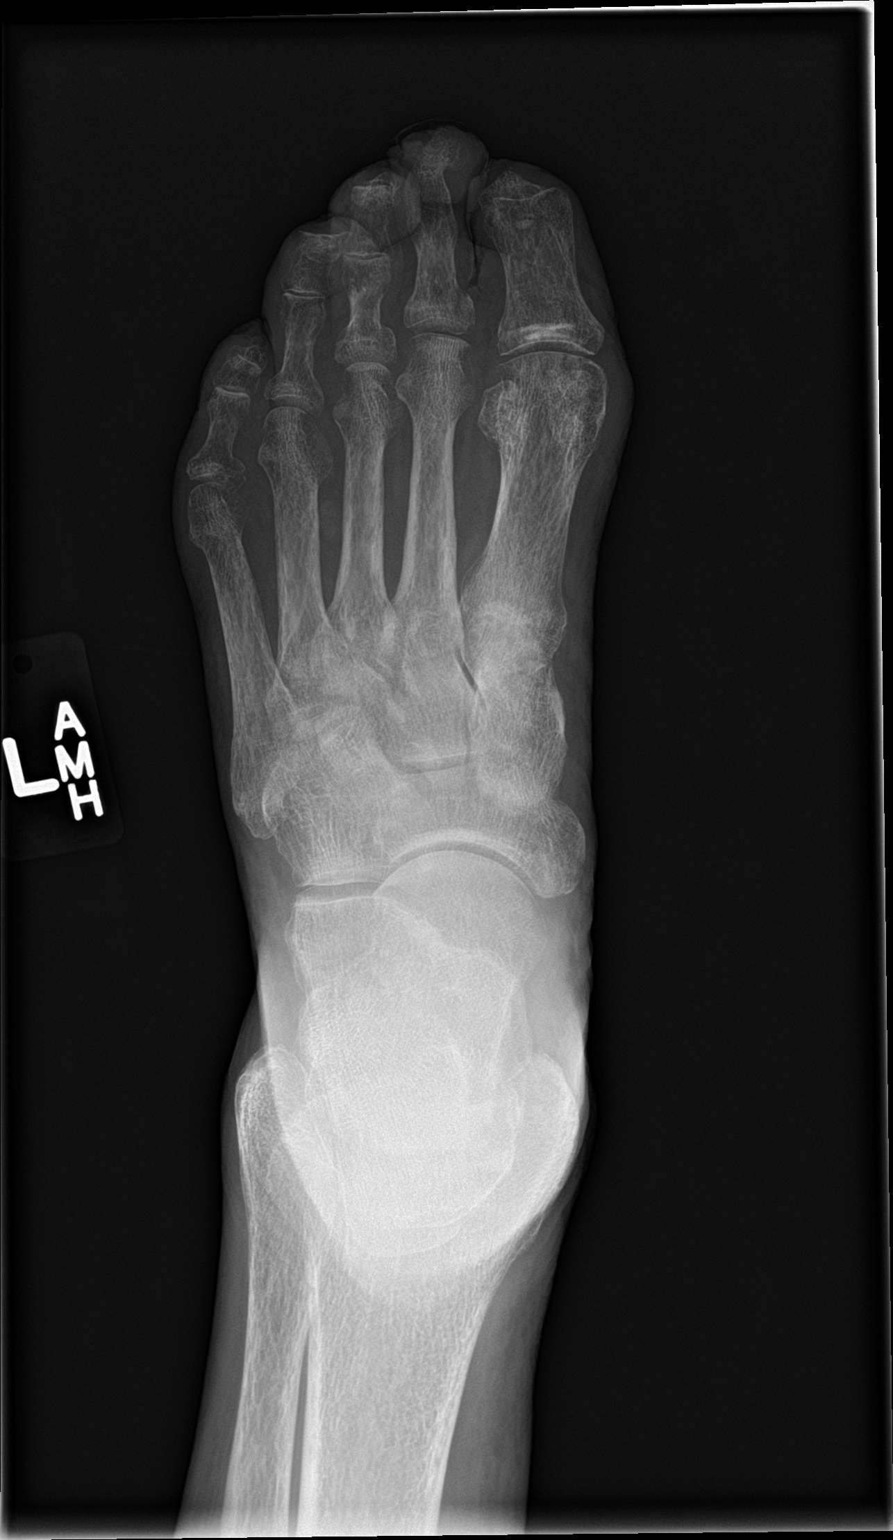

[foot obl]
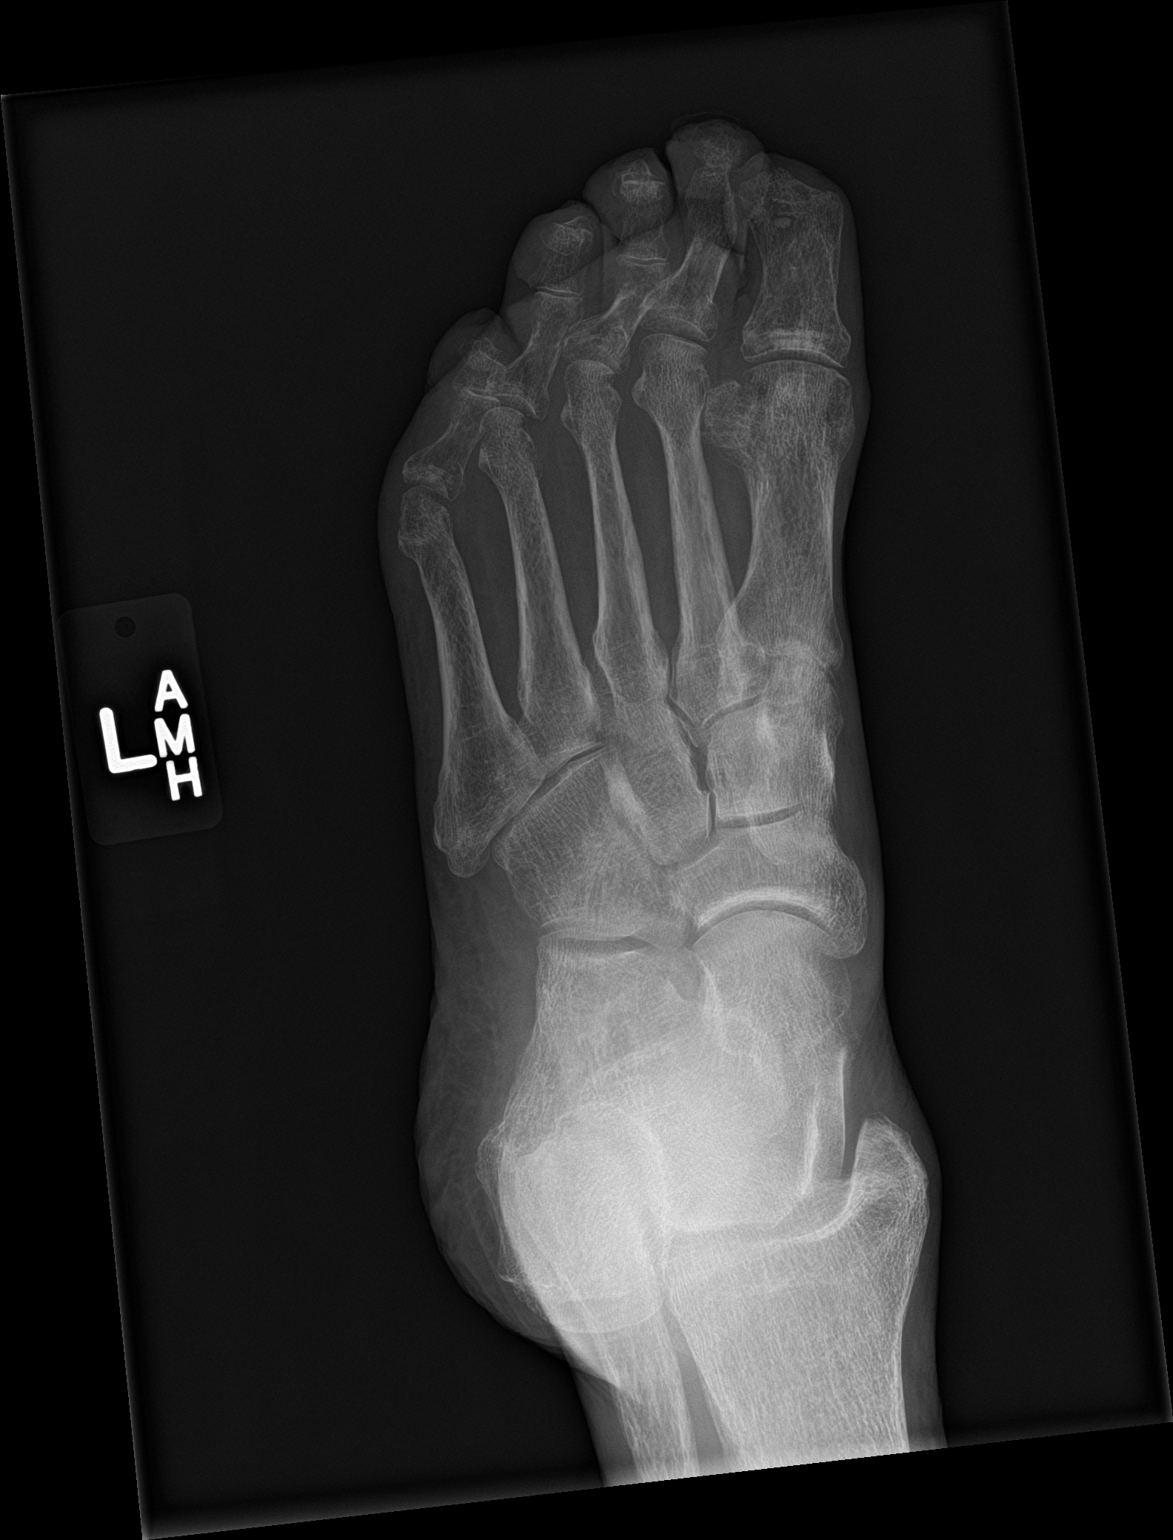

[foot lat]
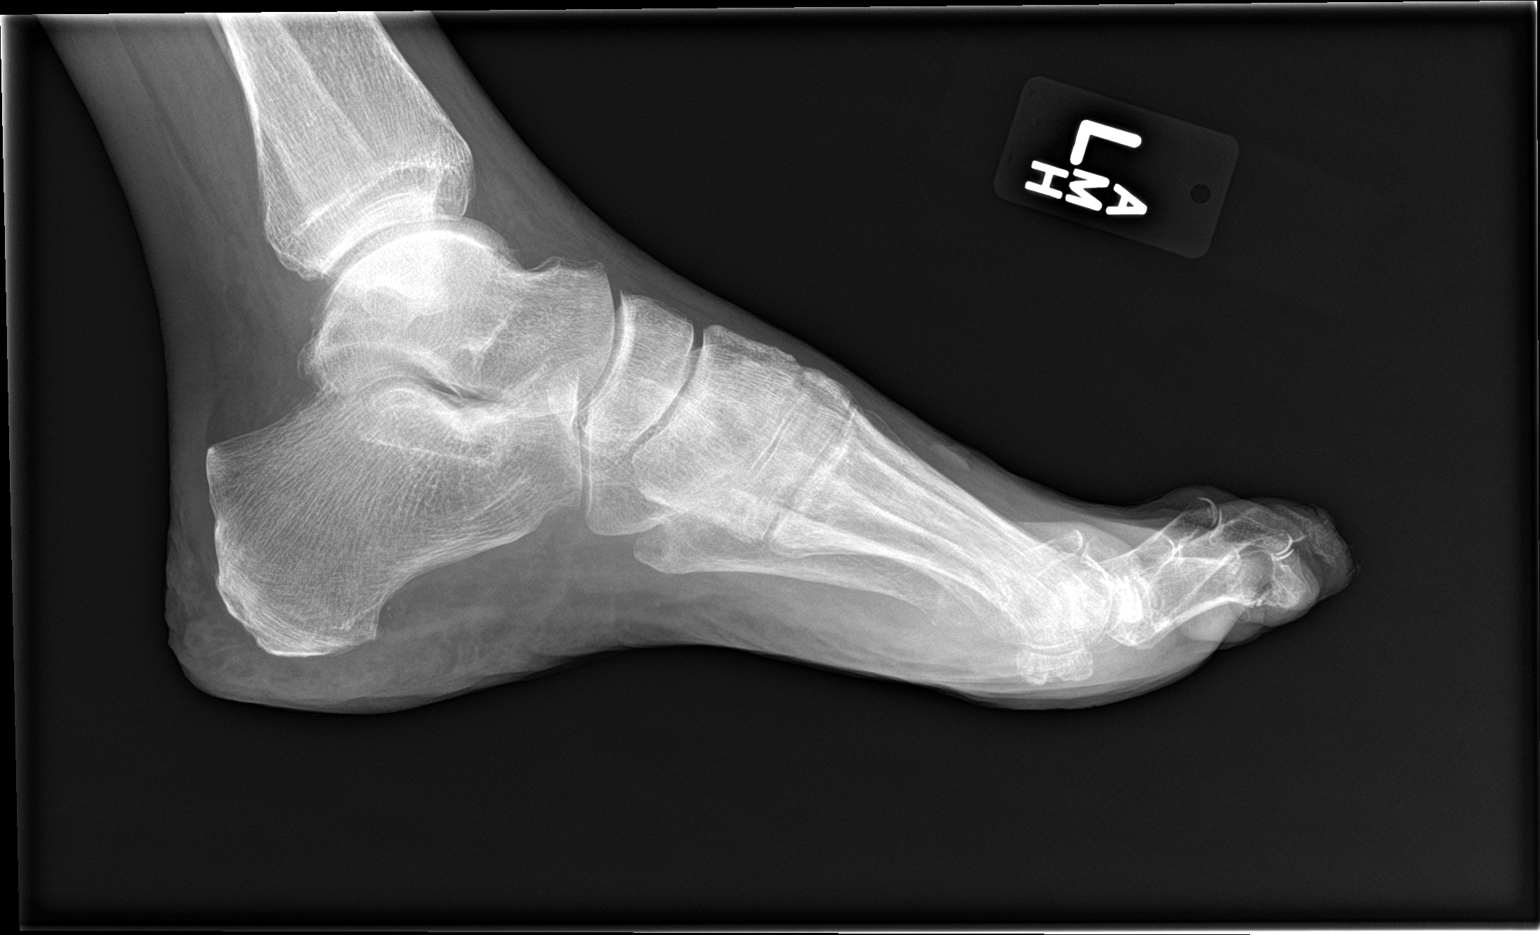

[3 of 3 positions shown; findings below may reference images not displayed]

FINDINGS: Unchanged eroded appearance to the distal phalanges of the first and
second digits with overlying skin irregularity at the second digit.
No osteomyelitis was reported on MRI 09/13/2015. No new erosive
changes are seen. No joint destruction or narrowing acutely. There
is chronic ankylosis at the second digit. Osteopenia without
fracture or subluxation. No soft tissue emphysema or foreign body.
IMPRESSION: Chronic erosion of the first and second distal phalanges. No
apparent change compared to radiography and MRI 09/11/2015 and
09/13/2015 at [REDACTED].
